# Patient Record
Sex: Male | Born: 1935 | Race: White | Hispanic: No | Marital: Married | Smoking: Former smoker
Health system: Southern US, Community
[De-identification: ages and names within clinical notes are randomized; demographics above are authoritative.]

## PROBLEM LIST (undated history)

## (undated) DIAGNOSIS — E119 Type 2 diabetes mellitus without complications: Secondary | ICD-10-CM

## (undated) DIAGNOSIS — R55 Syncope and collapse: Secondary | ICD-10-CM

## (undated) DIAGNOSIS — I499 Cardiac arrhythmia, unspecified: Secondary | ICD-10-CM

## (undated) DIAGNOSIS — I480 Paroxysmal atrial fibrillation: Secondary | ICD-10-CM

## (undated) DIAGNOSIS — R410 Disorientation, unspecified: Secondary | ICD-10-CM

## (undated) DIAGNOSIS — E785 Hyperlipidemia, unspecified: Secondary | ICD-10-CM

## (undated) DIAGNOSIS — I10 Essential (primary) hypertension: Secondary | ICD-10-CM

## (undated) HISTORY — DX: Disorientation, unspecified: R41.0

## (undated) HISTORY — PX: NO PAST SURGERIES: SHX2092

---

## 2001-05-27 ENCOUNTER — Emergency Department (HOSPITAL_COMMUNITY): Admission: EM | Admit: 2001-05-27 | Discharge: 2001-05-27 | Payer: Self-pay | Admitting: Emergency Medicine

## 2007-03-10 ENCOUNTER — Inpatient Hospital Stay (HOSPITAL_COMMUNITY): Admission: EM | Admit: 2007-03-10 | Discharge: 2007-03-12 | Payer: Self-pay | Admitting: Emergency Medicine

## 2007-03-11 ENCOUNTER — Encounter (INDEPENDENT_AMBULATORY_CARE_PROVIDER_SITE_OTHER): Payer: Self-pay | Admitting: Internal Medicine

## 2010-05-23 NOTE — H&P (Signed)
NAME:  Gerald [name], Gerald [name] NO.:  [id number]   MEDICAL RECORD NO.:  [id number]          PATIENT TYPE:  EMS   LOCATION:  MAJO                         FACILITY:  [facility]   PHYSICIAN:  Herbie Saxon, MDDATE OF BIRTH:  12/31/1927   DATE OF ADMISSION:  03/10/2007  DATE OF DISCHARGE:                              HISTORY & PHYSICAL   PRIMARY CARE PHYSICIAN:  Gloriajean Dell. Andrey Campanile, M.D.   Health care Karthikeya Funke is his wife, Aurea Graff, [phone] phone number.   CODE STATUS:  He is a full code.   PRESENTING COMPLAINT:  Right arm weakness x1 day.   HISTORY OF PRESENT ILLNESS:  This is a 77 year old Caucasian male with  past medical history of hypertension, diabetes, hyperlipidemia, remote  history of tobacco abuse who was quite well until 5:30 A.M. this morning  when he woke up to feed the dog and he noted weakness in the right arm.  This weakness has spontaneously resolved and he walked by himself to the  emergency room.  He also noticed popping sensation in bilateral side of  the neck over last night but he denies any headache, no dizziness;  there is no palpitations, no chest pain, no shortness of breath.  There  is no slurring of speech.  There is no leg weakness, no unstable gait,  no drooling of saliva.  The patient has never experienced this type of  weakness episode before.  No history of syncopal episode.  Denies any  skin rash or joint swelling.  No symptoms referable to the genitourinary  or gastrointestinal system.  He has given the history himself.  There is  no features of aphasia.   PAST MEDICAL HISTORY:  1. Hypertension.  2. Diabetes.  3. Hyperlipidemia.   PAST SURGICAL HISTORY:  Nil of note.   FAMILY HISTORY:  Father diabetes. Brother Alzheimer's disease.  Brother  heart disease.   SOCIAL HISTORY:  He is a retired Merchandiser, retail at [employer].  He quit smoking  more than 30 years ago.  He had smoked a pipe and cigarettes, less than  one pack per day for upwards  of 25 years.  No history of illicit drugs  or alcohol abuse.  He is married with children.   MEDICATIONS:  1. Lipitor one tablet daily.  2. Lisinopril one tablet daily.  3. Metformin one tablet twice daily.   ALLERGIES:  No known drug allergies.   REVIEW OF SYSTEMS:  Fourteen systems were reviewed.  Pertinent history  as in the history of presenting illness.   PHYSICAL EXAMINATION:  GENERAL APPEARANCE:  This is an elderly man, not  in acute respiratory distress.  VITAL SIGNS:  Temperature is 98,  pulse is 66, respiratory rate 16,  blood pressure 152/77.  HEENT: Pupils equal, round, reactive to light and accommodation.  Extraocular movements intact.  Head is normocephalic, atraumatic.  Mucous membranes moist.  Oropharynx and nasopharynx are clear.  NECK:  Supple.  There is no elevated JVD, thyromegaly or carotid bruit.  HEART:  Heart S1 and S2, regular rate and rhythm.  CHEST:  Clinically clear.  ABDOMEN:  Soft, nontender,  no organomegaly.  Bowel sounds are  normoactive.  Inguinal orifices are patent.  NEUROLOGICAL:  He is alert and oriented to time, place and person.  Power is 5 in all limbs.  Deep tendon reflexes 2+ globally and cranial  nerves II-XII are grossly intact.  Strength is grossly intact.  Peripheral pulses present.  No pedal edema.   LABORATORY DATA:  CBC with white blood cell count 4.8, hematocrit 40,  platelet count 170,000.  INR is 0.9.  PTT 27.  PT 12.1.  Chemistries  show sodium of 138, potassium 3.8, chloride 105, BUN 15, creatinine 1.0.  EKG:  62 beats per minute.   ASSESSMENT:  1. Transient ischemic attack, rule out acute cerebrovascular accident.  2. Moderate hypertension.  3. Type 2 diabetes , suboptimal control.  4. Hyperlipidemia on Statin.  5. Remote history of tobacco abuse.  6. First degree heart block.   PLAN:  The patient is to be admitted to neuro-telemetry bed.  Will work  him up with an MRI brain and MRA head and neck.  He is to be on  bedrest  and strict falls, seizure and operation precautions.  Continue his home  medications of Lipitor, lisinopril, metformin, add amlodipine 5 mg p.o.  daily.  Accu-Cheks a.c. and h.s. with sliding scale insulin NovoLog  coverage to optimize his diabetes control.  He will be on Lovenox 40 mg  subcutaneously daily and aspirin 325 mg daily, Protonix 40 mg p.o.  daily, DuoNeb nebulizer one unit dose q.6h. p.r.n.  Will get a thyroid  function test, fasting lipids, homocysteine.  Consider neurology  evaluation as in or outpatient.  His medications, tests and treatment  plans have been explained to him and his family.  They verbalized  understanding.   Time of encounter 45 minutes.      Herbie Saxon, MD  Electronically Signed     MIO/MEDQ  D:  03/10/2007  T:  03/10/2007  Job:  [id number]   cc:   Gloriajean Dell. Andrey Campanile, M.D.

## 2010-05-23 NOTE — Discharge Summary (Signed)
Gerald [name], LYSTER NO.:  [id number]   MEDICAL RECORD NO.:  [id number]          PATIENT TYPE:  INP   LOCATION:  3012                         FACILITY:  [facility]   PHYSICIAN:  Altha Harm, MDDATE OF BIRTH:  12/31/1927   DATE OF ADMISSION:  03/10/2007  DATE OF DISCHARGE:  03/12/2007                               DISCHARGE SUMMARY   DISCHARGE DISPOSITION:  Home.   FINAL DISCHARGE DIAGNOSES:  1. Acute left frontal lobe infarct.  2. Hyperhomocysteinemia.  3. Diabetes, type 2, poorly-controlled.  4. History of hypertension.  5. History of hyperlipidemia, currently on therapy.  6. History of hypertension, blood pressure well-controlled.   DISCHARGE MEDICATIONS:  1. Folate 1 mg p.o. daily.  2. Amaryl 1 mg p.o. daily.  3. Metformin 1000 mg p.o. b.i.d.  4. Lipitor 10 mg p.o. daily.  5. Lisinopril/hydrochlorothiazide 20/25 mg p.o. daily.  6. Ocuvite one tablet p.o. b.i.d.   CONSULTANTS:  None.   PROCEDURES:  None.   DIAGNOSTIC STUDIES:  1. MRI of the brain with and without contrast, which shows:  Acute nonhemorrhagic infarct, posterior left frontal lobe.  Atrophy without hydrocephalus.  Prominent small-vessel-disease type changes.  No abnormal intracranial enhancing lesion.  Prominent cerebrospinal fluid space, posterior and superior right  parietal region.  1. Angiography of the head, which shows:  Mild branch basilar intracranial atherosclerotic type changes.  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  Right vertebral artery appears to end in a pica distribution.  1. MRA of the neck, which shows:  Mild narrowing of the proximal internal carotid arteries bilaterally,  without evidence of a hemodynamically significant stenosis.  Left vertebral artery dominant.  Mild narrowing and ectasia of the proximal left vertebral artery.  Focal  loss of signal, proximal right vertebral artery, which may represent  result  of narrowing, versus artifact or combination of such.  Right vertebral artery appears to end in a pica distribution.  1. Chest x-ray, portable, one view, which shows bilateral partially      calcified pleural flecks, consistent with previous asbestos      exposure, no acute findings.  2. Two-D echocardiogram, which shows an ejection fraction of 60-75%.      No diagnostic evidence of a left ventricular regional wall motion      abnormality.  Left ventricular wall thickness was mildly increased.      There is an increased relative contribution of atrial contraction      to left ventricular filling.  No likely discrete intracardiac      source of emboli present.   CHIEF COMPLAINT:  Weakness in the right upper extremity.   HISTORY OF PRESENT ILLNESS:  Please see the H&P dictated by Dr. Christella Noa  for details of the HPI.  However, this is a gentleman, who was sitting  and got up to feed his dogs and recognized that he had weakness in the  right upper extremity.  The patient had a full resolution of symptoms by  the time he arrived at the emergency room.   HOSPITAL COURSE:  1. The patient was  admitted to the hospital under observation.  He was      placed on telemetry and showed no evidence of ectopy or arrhythmia      while on telemetry.  Studies were done as noted above.  The patient      had no apparent abnormalities noted.  He is currently able to      ambulate on a level surface without any difficulty.  The patient is      able to dress himself without any difficulty.  He has had no      problems with speech of any dysphagia.  Currently, the patient is      requesting to go home and to have physical therapy evaluate him in      the home setting or outpatient setting.  Please note that PT, OT      and speech have not yet seen him.  However, the patient is      requesting to go home and, without any apparent evidence of any      overt symptoms, I think it is safe for the patient to go  home and      have his therapies done in the home setting.  In terms of the      findings on the MRI, I have discussed this with Dr. Orlin Hilding for      neurology.  She agrees with me that the findings of the aneurysm      are likely an over-read by radiology and recommends that the      patient have a followup MRI within one year to further evaluate any      changes.  In terms of his hyperhomocysteinemia, the patient was      found to have a homocystine level elevated at 19.5 and has been      placed on folate 1 mg daily.  2. Diabetes, type 2:  The patient had poorly controlled diabetes with      a hemoglobin A1c of 8.2.  The patient has been on Glucophage 1000      mg b.i.d. and I have added Amaryl 1 mg.  He will probably need      further titration as an outpatient.  Please note that his      cholesterol was checked and the patient has an LDL of 104 and an      HDL of 25.  He is continued on his Lipitor and his blood pressures      were well-controlled and he was continued on his      lisinopril/hydrochlorothiazide.   RECOMMENDATIONS:  For the patient to follow up with his primary care  physician, Dr. Andrey Campanile, in three to five days and for home health  evaluation of PT and OT.      Altha Harm, MD  Electronically Signed     MAM/MEDQ  D:  03/12/2007  T:  03/12/2007  Job:  [id number]

## 2010-10-02 LAB — CK TOTAL AND CKMB (NOT AT ARMC)
CK, MB: 1
Relative Index: INVALID
Relative Index: INVALID
Relative Index: INVALID

## 2010-10-02 LAB — DIFFERENTIAL
Eosinophils Relative: 3
Lymphocytes Relative: 24
Lymphs Abs: 1.2
Monocytes Absolute: 0.4

## 2010-10-02 LAB — I-STAT 8, (EC8 V) (CONVERTED LAB)
BUN: 15
Chloride: 105
HCT: 43
Hemoglobin: 14.6
Operator id: 234501
Sodium: 138

## 2010-10-02 LAB — TROPONIN I: Troponin I: 0.01

## 2010-10-02 LAB — CBC
HCT: 40.8
Hemoglobin: 14.1
WBC: 4.8

## 2010-10-02 LAB — LIPID PANEL
Cholesterol: 155
HDL: 24 — ABNORMAL LOW
Total CHOL/HDL Ratio: 6.5

## 2010-10-02 LAB — HEPATIC FUNCTION PANEL
ALT: 13
Alkaline Phosphatase: 78
Bilirubin, Direct: <0.1
Total Protein: 6.5

## 2010-10-02 LAB — POCT I-STAT CREATININE
Creatinine, Ser: 1
Operator id: 234501

## 2012-09-12 ENCOUNTER — Ambulatory Visit: Payer: Self-pay | Admitting: Cardiovascular Disease

## 2012-09-19 ENCOUNTER — Ambulatory Visit (INDEPENDENT_AMBULATORY_CARE_PROVIDER_SITE_OTHER): Payer: Medicare Other | Admitting: Cardiovascular Disease

## 2012-09-19 ENCOUNTER — Encounter: Payer: Self-pay | Admitting: Cardiovascular Disease

## 2012-09-19 VITALS — BP 118/70 | HR 117 | Ht 71.0 in | Wt 162.8 lb

## 2012-09-19 DIAGNOSIS — I4891 Unspecified atrial fibrillation: Secondary | ICD-10-CM

## 2012-09-19 DIAGNOSIS — E785 Hyperlipidemia, unspecified: Secondary | ICD-10-CM

## 2012-09-19 DIAGNOSIS — E119 Type 2 diabetes mellitus without complications: Secondary | ICD-10-CM

## 2012-09-19 DIAGNOSIS — Z7189 Other specified counseling: Secondary | ICD-10-CM

## 2012-09-19 MED ORDER — METOPROLOL SUCCINATE ER 50 MG PO TB24: 50.0000 mg | ORAL_TABLET | Freq: Every day | ORAL | Status: DC

## 2012-09-19 MED ORDER — APIXABAN 2.5 MG PO TABS: ORAL_TABLET | ORAL | Status: DC

## 2012-09-19 NOTE — Patient Instructions (Addendum)
Your physician recommends that you return for lab work fasting.  Your physician has requested that you have an echocardiogram. Echocardiography is a painless test that uses sound waves to create images of your heart. It provides your doctor with information about the size and shape of your heart and how well your heart's chambers and valves are working. This procedure takes approximately one hour. There are no restrictions for this procedure.  Your physician has recommended you make the following change in your medication: start Eliquis as directed. Increase the metoprolol to 50 mg once daily.  Your physician recommends that you schedule a follow-up appointment in: 2-3 weeks.

## 2012-09-26 ENCOUNTER — Encounter: Payer: Self-pay | Admitting: Cardiovascular Disease

## 2012-09-26 DIAGNOSIS — E785 Hyperlipidemia, unspecified: Secondary | ICD-10-CM | POA: Insufficient documentation

## 2012-09-26 DIAGNOSIS — E119 Type 2 diabetes mellitus without complications: Secondary | ICD-10-CM | POA: Insufficient documentation

## 2012-09-26 NOTE — Progress Notes (Signed)
Patient ID: Raelyn Number, male   DOB: March 08, 1926, 79 y.o.   MRN: [id number]     PATIENT PROFILE:  Gerald [name] is an 79 year old gentleman who is referred through the courtesy of Dr. Benedetto Goad for evaluation of new onset atrial fibrillation   HPI: Gerald [name] denies any known prior history of cardiac arrhythmia. He does have a history of type 2 diabetes mellitus, hyperlipidemia, hypertension, and at times has noticed some mild dizziness. He apparently saw Dr. Benedetto Goad on 08/20/2012 with an irregular heart rhythm and was found to be in atrial fibrillation. He now presents for cardiology evaluation. Is to be sent denies chest pain. He denies syncope. He denies wheezing. He had been taking metoprolol succinate 25 mg in addition to Prinzide 20/25 aspirin 81 mg Lipitor 20 mg in addition to his Amaryl and metformin for diabetes.   History reviewed. No pertinent past surgical history.  No Known Allergies  Current Outpatient Prescriptions  Medication Sig Dispense Refill  . aspirin 81 MG tablet Take 81 mg by mouth daily.      Marland Kitchen atorvastatin (LIPITOR) 20 MG tablet Take 20 mg by mouth daily.      . folic acid (FOLVITE) 1 MG tablet Take 1 mg by mouth daily.      Marland Kitchen glimepiride (AMARYL) 1 MG tablet Take 1 mg by mouth daily before breakfast.      . lisinopril-hydrochlorothiazide (PRINZIDE,ZESTORETIC) 20-25 MG per tablet Take 1 tablet by mouth daily. Takes 1/2 tablet daily      . metFORMIN (GLUCOPHAGE) 1000 MG tablet Take 1,000 mg by mouth 2 (two) times daily with a meal.      . Multiple Vitamins-Minerals (PRESERVISION AREDS PO) Take 1 tablet by mouth 2 (two) times daily.      . tamsulosin (FLOMAX) 0.4 MG CAPS capsule Take by mouth.      Marland Kitchen apixaban (ELIQUIS) 2.5 MG TABS tablet Take 1/2 tablet  7 tablet  0  . metoprolol succinate (TOPROL-XL) 50 MG 24 hr tablet Take 1 tablet (50 mg total) by mouth daily. Take with or immediately following a meal.  30 tablet  6   No current  facility-administered medications for this visit.    Social history is notable in that he is married. He has one child 3 grandchildren and one great-grandchild. 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 He was in the [employer] from 1951 through 1955. He previously had smoked a pipe but quit 30 years ago. There is no alcohol use.  Family History  Problem Relation Age of Onset  . Heart disease Mother   . Heart failure Mother     ROS is negative for fever chills or night sweats. He denies visual changes. He denies rash. He denies tremors. He denies any known thyroid problems. He denies chest pressure. He denies change in bowel or bladder habits. He denies bleeding. There is no claudication. He denies significant edema. Other system review is negative.  PE BP 118/70  Pulse 117  Ht 5\' 11"  (1.803 m)  Wt 162 lb 12.8 oz (73.846 kg)  BMI 22.72 kg/m2 General: Alert, oriented, no distress.  Skin: normal turgor, no rashes HEENT: Normocephalic, atraumatic. Pupils round and reactive; sclera anicteric; Fundi mild arteriolar narrowing. No hemorrhages or exudates. Nose without nasal septal hypertrophy Mouth/Parynx benign; full dentures;  Mallinpatti scale 3 Neck: No JVD, no carotid briuts Lungs: clear to ausculatation and percussion; no wheezing or rales Heart:  Irregularly irregular rhythm at 117 beats per minute, 1/6 systolic murmur Abdomen: soft, nontender; no hepatosplenomehaly, BS+; abdominal aorta nontender and not dilated by palpation. Pulses 2+ Extremities: no clubbinbg cyanosis or edema, Homan's sign negative  Neurologic: grossly nonfocal    ECG: Atrial fibrillation at 117 beats per minute. No significant ST-T changes.  LABS:  BMET    Component Value Date/Time   NA 138 03/10/2007 1215   K 3.8 03/10/2007 1215   CL 105 03/10/2007 1215   GLUCOSE 137* 03/10/2007 1215   BUN 15 03/10/2007 1215   CREATININE 1.0 03/10/2007 1215     Hepatic  Function Panel     Component Value Date/Time   PROT 6.5 03/10/2007 2045   ALBUMIN 3.6 03/10/2007 2045   AST 21 03/10/2007 2045   ALT 13 03/10/2007 2045   ALKPHOS 78 03/10/2007 2045   BILITOT 0.5 03/10/2007 2045   BILIDIR <0.1 03/10/2007 2045   IBILI NOT CALCULATED 03/10/2007 2045     CBC    Component Value Date/Time   WBC 4.8 03/10/2007 1145   RBC 4.92 03/10/2007 1145   HGB 14.6 03/10/2007 1215   HCT 43.0 03/10/2007 1215   PLT 170 03/10/2007 1145   MCV 82.9 03/10/2007 1145   MCHC 34.7 03/10/2007 1145   RDW 13.9 03/10/2007 1145   LYMPHSABS 1.2 03/10/2007 1145   MONOABS 0.4 03/10/2007 1145   EOSABS 0.1 03/10/2007 1145   BASOSABS 0.0 03/10/2007 1145     BNP No results found for this basename: probnp    Lipid Panel     Component Value Date/Time   CHOL  Value: 155        ATP III CLASSIFICATION:  <200     mg/dL   Desirable  161-096  mg/dL   Borderline High  >=045    mg/dL   High 4/0/9811 9147   TRIG 136 03/11/2007 0535   HDL 24* 03/11/2007 0535   CHOLHDL 6.5 03/11/2007 0535   VLDL 27 03/11/2007 0535   LDLCALC  Value: 104        Total Cholesterol/HDL:CHD Risk Coronary Heart Disease Risk Table                     Men   Women  1/2 Average Risk   3.4   3.3* 03/11/2007 0535     RADIOLOGY: No results found.   ASSESSMENT AND PLAN: Gerald [name] is an 79 year old white gentleman with a history of diabetes mellitus, hypertension, and hyperlipidemia who now presents with atrial fibrillation documented initially on 08/20/2012 when seen by Dr. Andrey Campanile. Presently, I have discussed with him the importance of initiating anticoagulation therapy for reduction of thromboembolic risk.  I will start him on low-dose Eliquis at 2.5 mg twice a day. I further titrating his Toprol XL to 50 mg daily. I am scheduling him for a 2-D echo Doppler study as well as laboratory. I will see him back in the office in 2-3 weeks for followup evaluation.   Gerald Bihari, MD, [facility] 09/26/2012 5:57 PM

## 2012-09-29 ENCOUNTER — Ambulatory Visit (HOSPITAL_COMMUNITY)
Admission: RE | Admit: 2012-09-29 | Discharge: 2012-09-29 | Disposition: A | Discharge status: Home / Self Care | Payer: Medicare Other | Source: Ambulatory Visit | Attending: Cardiovascular Disease | Admitting: Cardiovascular Disease

## 2012-09-29 ENCOUNTER — Other Ambulatory Visit (HOSPITAL_COMMUNITY): Payer: Self-pay | Admitting: Cardiovascular Disease

## 2012-09-29 DIAGNOSIS — I4891 Unspecified atrial fibrillation: Secondary | ICD-10-CM | POA: Insufficient documentation

## 2012-09-29 NOTE — Progress Notes (Signed)
2D Echo Performed 09/29/2012    Dalanie Kisner, RCS  

## 2012-09-30 LAB — COMPREHENSIVE METABOLIC PANEL
Albumin: 4.1 g/dL (ref 3.5–5.2)
CO2: 28 mEq/L (ref 19–32)
Glucose, Bld: 150 mg/dL — ABNORMAL HIGH (ref 70–99)
Sodium: 142 mEq/L (ref 135–145)
Total Bilirubin: 0.7 mg/dL (ref 0.3–1.2)
Total Protein: 7.4 g/dL (ref 6.0–8.3)

## 2012-09-30 LAB — CBC
MCV: 82.2 fL (ref 78.0–100.0)
Platelets: 207 10*3/uL (ref 150–400)
RBC: 4.77 MIL/uL (ref 4.22–5.81)
WBC: 5.5 10*3/uL (ref 4.0–10.5)

## 2012-09-30 LAB — LIPID PANEL
Cholesterol: 131 mg/dL (ref 0–200)
Triglycerides: 84 mg/dL (ref <150)

## 2012-09-30 LAB — MAGNESIUM: Magnesium: 1.8 mg/dL (ref 1.5–2.5)

## 2012-10-02 ENCOUNTER — Telehealth: Payer: Self-pay | Admitting: Cardiovascular Disease

## 2012-10-02 ENCOUNTER — Encounter: Payer: Self-pay | Admitting: Cardiovascular Disease

## 2012-10-02 ENCOUNTER — Ambulatory Visit (INDEPENDENT_AMBULATORY_CARE_PROVIDER_SITE_OTHER): Payer: Medicare Other | Admitting: Cardiovascular Disease

## 2012-10-02 VITALS — BP 134/74 | HR 112 | Ht 71.0 in | Wt 160.5 lb

## 2012-10-02 DIAGNOSIS — E119 Type 2 diabetes mellitus without complications: Secondary | ICD-10-CM

## 2012-10-02 DIAGNOSIS — I4891 Unspecified atrial fibrillation: Secondary | ICD-10-CM

## 2012-10-02 DIAGNOSIS — E785 Hyperlipidemia, unspecified: Secondary | ICD-10-CM

## 2012-10-02 DIAGNOSIS — Z7189 Other specified counseling: Secondary | ICD-10-CM

## 2012-10-02 MED ORDER — APIXABAN 2.5 MG PO TABS: 2.5000 mg | ORAL_TABLET | Freq: Two times a day (BID) | ORAL | Status: DC

## 2012-10-02 MED ORDER — PROPAFENONE HCL 150 MG PO TABS: 150.0000 mg | ORAL_TABLET | Freq: Three times a day (TID) | ORAL | Status: DC

## 2012-10-02 NOTE — Telephone Encounter (Signed)
Returned call to pt and spoke w/ pt's son, Gerald [name].  Stated he has questions about Eliquis and Metoprolol.  Confirmed pt is supposed to take Eliquis 2.5 mg twice daily and Metoprolol 25 mg daily.  Son stated pt has Eliquis 5 mg tabs and advised pt take 1/2 tab twice daily.  Pt also has Metoprolol 50 mg tabs and advised to take 1/2 tab daily.  Son verbalized understanding.  Rx sent to pharmacy for Eliquis per son's request.  Pt just refilled Metoprolol and does not need refills per son.

## 2012-10-02 NOTE — Progress Notes (Signed)
Patient ID: Gerald [name], male   DOB: 1926-10-16, 79 y.o.   MRN: [id number]     HPI: Gerald [name] is an 79 year old gentleman who I saw initially on 09/19/2012 30 courtesy of Dr. Benedetto Goad for evaluation of new onset atrial fibrillation.  He does have a history of type 2 diabetes mellitus, hyperlipidemia, hypertension, and at times has noticed some mild dizziness. He apparently saw Dr. Benedetto Goad on 08/20/2012 with an irregular heart rhythm and was found to be in atrial fibrillation. When I initially saw him he denied any chest pain, presyncope or syncope. He denies wheezing. He had been taking metoprolol succinate 25 mg in addition to Prinzide 20/25 aspirin 81 mg Lipitor 20 mg in addition to his Amaryl and metformin for diabetes. At that time, I recommended further titration of his metoprolol succinate 50 mg daily. Also started on Eliquis 2.5 mg twice a day. I scheduled him for a 2-D echo Doppler study which confirmed normal systolic function although he did not moderate concentric LVH. Ejection fraction was 55-60%. There was mild aortic valve sclerosis without stenosis and mild AR the head mild biatrial enlargement. Apparently, Gerald [name] never did titrate his Toprol to 50 mg daily.  He states he has noticed that he has had some presyncopal-like spells after he had taken his Prinzide. He presents to the office today for followup evaluation.   History reviewed. No pertinent past surgical history.  No Known Allergies  Current Outpatient Prescriptions  Medication Sig Dispense Refill  . apixaban (ELIQUIS) 2.5 MG TABS tablet Take 1/2 tablet  7 tablet  0  . aspirin 81 MG tablet Take 81 mg by mouth daily.      Marland Kitchen atorvastatin (LIPITOR) 20 MG tablet Take 20 mg by mouth daily.      . folic acid (FOLVITE) 1 MG tablet Take 1 mg by mouth daily.      Marland Kitchen glimepiride (AMARYL) 1 MG tablet Take 1 mg by mouth daily before breakfast.      . lisinopril-hydrochlorothiazide (PRINZIDE,ZESTORETIC) 20-25 MG per  tablet Take 1 tablet by mouth daily. Takes 1/2 tablet daily      . metFORMIN (GLUCOPHAGE) 1000 MG tablet Take 1,000 mg by mouth 2 (two) times daily with a meal.      . metoprolol succinate (TOPROL-XL) 25 MG 24 hr tablet Take 25 mg by mouth daily.      . Multiple Vitamins-Minerals (PRESERVISION AREDS PO) Take 1 tablet by mouth 2 (two) times daily.      . tamsulosin (FLOMAX) 0.4 MG CAPS capsule Take by mouth.       No current facility-administered medications for this visit.    Social history is notable in that he is married. He has one child 3 grandchildren and one great-grandchild. 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 He was in the [employer] from 1951 through 1955. He previously had smoked a pipe but quit 30 years ago. There is no alcohol use.  Family History  Problem Relation Age of Onset  . Heart disease Mother   . Heart failure Mother     ROS is negative for fever chills or night sweats. He denies visual changes. He denies rash. He denies tremors. He denies any known thyroid problems. He denies chest pressure. He denies change in bowel or bladder habits. He denies bleeding. There is no claudication. He denies significant edema. Other system review is negative.  PE BP 134/74  Pulse  112  Ht 5\' 11"  (1.803 m)  Wt 160 lb 8 oz (72.802 kg)  BMI 22.4 kg/m2 General: Alert, oriented, no distress.  Skin: normal turgor, no rashes HEENT: Normocephalic, atraumatic. Pupils round and reactive; sclera anicteric; Fundi mild arteriolar narrowing. No hemorrhages or exudates. Nose without nasal septal hypertrophy Mouth/Parynx benign; full dentures;  Mallinpatti scale 3 Neck: No JVD, no carotid briuts Lungs: clear to ausculatation and percussion; no wheezing or rales Heart: Irregularly irregular rhythm at 110 - 120 beats per minute, 1/6 systolic murmur Abdomen: soft, nontender; no hepatosplenomehaly, BS+; abdominal aorta nontender and not  dilated by palpation. Pulses 2+ Extremities: no clubbinbg cyanosis or edema, Homan's sign negative  Neurologic: grossly nonfocal    ECG: Atrial fibrillation at 112 beats per minute. No significant ST-T changes. QTc interval 434 ms.  LABS:  BMET    Component Value Date/Time   NA 142 09/30/2012 0839   K 4.1 09/30/2012 0839   CL 100 09/30/2012 0839   CO2 28 09/30/2012 0839   GLUCOSE 150* 09/30/2012 0839   BUN 21 09/30/2012 0839   CREATININE 1.09 09/30/2012 0839   CREATININE 1.0 03/10/2007 1215   CALCIUM 9.5 09/30/2012 0839     Hepatic Function Panel     Component Value Date/Time   PROT 7.4 09/30/2012 0839   ALBUMIN 4.1 09/30/2012 0839   AST 18 09/30/2012 0839   ALT 9 09/30/2012 0839   ALKPHOS 90 09/30/2012 0839   BILITOT 0.7 09/30/2012 0839   BILIDIR <0.1 03/10/2007 2045   IBILI NOT CALCULATED 03/10/2007 2045     CBC    Component Value Date/Time   WBC 5.5 09/30/2012 0839   RBC 4.77 09/30/2012 0839   HGB 12.8* 09/30/2012 0839   HCT 39.2 09/30/2012 0839   PLT 207 09/30/2012 0839   MCV 82.2 09/30/2012 0839   MCH 26.8 09/30/2012 0839   MCHC 32.7 09/30/2012 0839   RDW 14.5 09/30/2012 0839   LYMPHSABS 1.2 03/10/2007 1145   MONOABS 0.4 03/10/2007 1145   EOSABS 0.1 03/10/2007 1145   BASOSABS 0.0 03/10/2007 1145     BNP No results found for this basename: probnp    Lipid Panel     Component Value Date/Time   CHOL 131 09/30/2012 0839   TRIG 84 09/30/2012 0839   HDL 44 09/30/2012 0839   CHOLHDL 3.0 09/30/2012 0839   VLDL 17 09/30/2012 0839   LDLCALC 70 09/30/2012 0839     RADIOLOGY: No results found.   ASSESSMENT AND PLAN: Gerald [name] is an 79 year old white gentleman with a history of diabetes mellitus, hypertension, and hyperlipidemia who has documented atrial fibrillation for at least several months when he was initially seen by Dr. Andrey Campanile the atrial fibrillation documented initially on 08/20/2012 when seen by Dr. Andrey Campanile. He apparently never did further titrate his metoprolol to 50 mg and  has only been taking Toprol XL 25 mg daily. Heart rate today is essentially unchanged from his initial evaluation.  I am now  recommending that he try to increase this to 25 mg twice a day. With his echo demonstrating normal systolic function I will try initiating Propofenone150 mg every 8 hours. I have recommended that he discontinue his Prinzide. He is now on anticoagulation for several weeks. I'll see him back in the office in 2 weeks. At that time he is still in atrial fibrillation plans will be made to schedule him for outpatient DC cardioversion. If his pulse gets below 60 he will hold his evening dose of Toprol.  Since he does not have established coronary artery disease I recommended he discontinue his baby aspirin to continue with the Elliquis 2.5 twice a day.  Lennette Bihari, MD, [facility] 10/02/2012 9:59 AM

## 2012-10-02 NOTE — Patient Instructions (Addendum)
Your physician has recommended you make the following change in your medication: take the metoprolol 25 mg twice daily.  start the new prescription for propafenone 150 mg. This has already been sent to your pharmacy. STOP your baby asiprin.  Stop the lisinopril.  Your physician recommends that you schedule a follow-up appointment in: 2 WEEKS.

## 2012-10-02 NOTE — Progress Notes (Signed)
Quick Note:  Called patient and gave normal results. He then informed me that he has appointment this morning. ______

## 2012-10-02 NOTE — Telephone Encounter (Signed)
Please call-just saw Dr Geoffry Paradise about his medicine.

## 2012-10-03 ENCOUNTER — Encounter: Payer: Self-pay | Admitting: Cardiovascular Disease

## 2012-10-09 ENCOUNTER — Telehealth: Payer: Self-pay | Admitting: Cardiovascular Disease

## 2012-10-09 NOTE — Telephone Encounter (Signed)
Please call-question about his medicine. °

## 2012-10-09 NOTE — Telephone Encounter (Signed)
Returned patient's [place] spoke with his wife. She was concerned that he had missed his scheduled dose of propafenone. He took it one hour later than he should have. Stated to her that this should be fine. Just push the next dose back by one hour. They can resume the normal schedule tomorrow. Wife voiced understanding.

## 2012-10-16 ENCOUNTER — Encounter: Payer: Self-pay | Admitting: Cardiovascular Disease

## 2012-10-16 ENCOUNTER — Ambulatory Visit (INDEPENDENT_AMBULATORY_CARE_PROVIDER_SITE_OTHER): Payer: Medicare Other | Admitting: Cardiovascular Disease

## 2012-10-16 VITALS — BP 136/70 | HR 79 | Ht 71.0 in | Wt 160.2 lb

## 2012-10-16 DIAGNOSIS — E785 Hyperlipidemia, unspecified: Secondary | ICD-10-CM

## 2012-10-16 DIAGNOSIS — I4891 Unspecified atrial fibrillation: Secondary | ICD-10-CM

## 2012-10-16 DIAGNOSIS — E119 Type 2 diabetes mellitus without complications: Secondary | ICD-10-CM

## 2012-10-16 DIAGNOSIS — Z01818 Encounter for other preprocedural examination: Secondary | ICD-10-CM

## 2012-10-16 NOTE — Progress Notes (Signed)
Patient ID: Gerald [name], male   DOB: 05/09/1926, 79 y.o.   MRN: [id number]       HPI: Gerald [name] is an 79-year-old gentleman who I saw initially on 09/19/2012 30 courtesy of Dr. Fred Wilson for evaluation of new onset atrial fibrillation.  He does have a history of type 2 diabetes mellitus, hyperlipidemia, hypertension, and at times has noticed some mild dizziness. On 08/20/2012 he was evaluated by Dr. Fred Wilson with an irregular heart rhythm and was found to be in atrial fibrillation. When I initially saw him he denied any chest pain, presyncope or syncope. He denies wheezing. He had been taking metoprolol succinate 25 mg in addition to Prinzide 20/25 aspirin 81 mg Lipitor 20 mg in addition to his Amaryl and metformin for diabetes. At that time, I recommended further titration of his metoprolol succinate 50 mg daily and started on Eliquis 2.5 mg twice a day. A 2-D echo Doppler study which confirmed normal systolic function although he did not moderate concentric LVH. Ejection fraction was 55-60%. There was mild aortic valve sclerosis without stenosis and mild AR the head mild biatrial enlargement.  Gerald [name] inadvertently never did titrate his Toprol to 50 mg daily.  He states he has noticed that he has had some presyncopal-like spells after he had taken his Prinzide. When I saw him on his last evaluation, at that time I did titrate his Toprol to 50 mg daily and also started him on Propulsid on 150 mg every 8 hours. At that time I recommended he discontinue his Prinzide. He presents to the office today for followup evaluation.  Gerald [name] does feel improved. He feels his heart rate is controlled. He is now walking several miles per day and does note improved energy.   History reviewed. No pertinent past surgical history.  No Known Allergies  Current Outpatient Prescriptions  Medication Sig Dispense Refill  . apixaban (ELIQUIS) 2.5 MG TABS tablet Take 1 tablet (2.5 mg total) by mouth 2  (two) times daily.  60 tablet  5  . atorvastatin (LIPITOR) 20 MG tablet Take 20 mg by mouth daily.      . folic acid (FOLVITE) 1 MG tablet Take 1 mg by mouth daily.      . glimepiride (AMARYL) 1 MG tablet Take 1 mg by mouth daily before breakfast.      . metFORMIN (GLUCOPHAGE) 1000 MG tablet Take 1,000 mg by mouth 2 (two) times daily with a meal.      . metoprolol succinate (TOPROL-XL) 25 MG 24 hr tablet Take 25 mg by mouth 2 (two) times daily.       . Multiple Vitamins-Minerals (PRESERVISION AREDS PO) Take 1 tablet by mouth 2 (two) times daily.      . propafenone (RYTHMOL) 150 MG tablet Take 1 tablet (150 mg total) by mouth every 8 (eight) hours.  30 tablet  6  . tamsulosin (FLOMAX) 0.4 MG CAPS capsule Take by mouth.       No current facility-administered medications for this visit.    Social history is notable in that he is married. He has one child 3 grandchildren and one great-grandchild. 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 He completed 12th grade of education. He was in the [employer] from 1951 through 1955. He previously had smoked a pipe but quit 30 years ago. There is no alcohol use.  Family History  Problem Relation Age of Onset  . Heart disease Mother   .   Heart failure Mother     ROS is negative for fever chills or night sweats. He denies visual changes. He denies rash. He denies tremors. He denies any known thyroid problems. He denies chest pressure. He denies change in bowel or bladder habits. He denies bleeding. There is no claudication. He denies significant edema. Other 12 point system review is negative.  PE BP 136/70  Pulse 79  Ht 5' 11" (1.803 m)  Wt 160 lb 3.2 oz (72.666 kg)  BMI 22.35 kg/m2 General: Alert, oriented, no distress.  Skin: normal turgor, no rashes HEENT: Normocephalic, atraumatic. Pupils round and reactive; sclera anicteric; Fundi mild arteriolar narrowing. No hemorrhages or exudates. Nose without nasal septal  hypertrophy Mouth/Parynx benign; full dentures;  Mallinpatti scale 3 Neck: No JVD, no carotid briuts Lungs: clear to ausculatation and percussion; no wheezing or rales Heart: Irregularly irregular rhythm with an improved rate in the 70s beats per minute, 1/6 systolic murmur Abdomen: soft, nontender; no hepatosplenomehaly, BS+; abdominal aorta nontender and not dilated by palpation. Pulses 2+ Extremities: no clubbinbg cyanosis or edema, Homan's sign negative  Neurologic: grossly nonfocal    ECG: Atrial fibrillation at 79 beats per minute. No significant ST-T changes. QTc interval 415 ms.  LABS:  BMET    Component Value Date/Time   NA 142 09/30/2012 0839   K 4.1 09/30/2012 0839   CL 100 09/30/2012 0839   CO2 28 09/30/2012 0839   GLUCOSE 150* 09/30/2012 0839   BUN 21 09/30/2012 0839   CREATININE 1.09 09/30/2012 0839   CREATININE 1.0 03/10/2007 1215   CALCIUM 9.5 09/30/2012 0839     Hepatic Function Panel     Component Value Date/Time   PROT 7.4 09/30/2012 0839   ALBUMIN 4.1 09/30/2012 0839   AST 18 09/30/2012 0839   ALT 9 09/30/2012 0839   ALKPHOS 90 09/30/2012 0839   BILITOT 0.7 09/30/2012 0839   BILIDIR <0.1 03/10/2007 2045   IBILI NOT CALCULATED 03/10/2007 2045     CBC    Component Value Date/Time   WBC 5.5 09/30/2012 0839   RBC 4.77 09/30/2012 0839   HGB 12.8* 09/30/2012 0839   HCT 39.2 09/30/2012 0839   PLT 207 09/30/2012 0839   MCV 82.2 09/30/2012 0839   MCH 26.8 09/30/2012 0839   MCHC 32.7 09/30/2012 0839   RDW 14.5 09/30/2012 0839   LYMPHSABS 1.2 03/10/2007 1145   MONOABS 0.4 03/10/2007 1145   EOSABS 0.1 03/10/2007 1145   BASOSABS 0.0 03/10/2007 1145     BNP No results found for this basename: probnp    Lipid Panel     Component Value Date/Time   CHOL 131 09/30/2012 0839   TRIG 84 09/30/2012 0839   HDL 44 09/30/2012 0839   CHOLHDL 3.0 09/30/2012 0839   VLDL 17 09/30/2012 0839   LDLCALC 70 09/30/2012 0839     RADIOLOGY: No results found.   ASSESSMENT AND PLAN:  Mr.  [name] is an 79-year-old white gentleman with a history of diabetes mellitus, hypertension, and hyperlipidemia. He has documented atrial fibrillation for at least several months when he was initially seen by Dr. Wilson the atrial fibrillation documented initially on 08/20/2012 when seen by Dr. Wilson. He has been on anticoagulation with eloquence 2.5 mg twice a day since I initially saw him on 09/19/2012. Today, his rate is significantly better controlled on his increased Toprol dose to 50 mg and with the addition of Toprol for now 150 mg every 8 hours. He has normal systolic function on   echo as noted above. After much discussion, we will now make plans to attempt outpatient DC cardioversion. His son will be out of town next week. For this reason, this will be scheduled to be done at [place] the following week. Laboratory obtained the week of his planned cardioversion.   Thomas A. Kelly, MD, FACC 10/16/2012 3:18 PM 

## 2012-10-16 NOTE — Patient Instructions (Signed)
A chest x-ray takes a picture of the organs and structures inside the chest, including the heart, lungs, and blood vessels. This test can show several things, including, whether the heart is enlarges; whether fluid is building up in the lungs; and whether pacemaker / defibrillator leads are still in place.  Your physician recommends that you return for lab work within 7 days of your procedure.  Your physician has recommended that you have a Cardioversion (DCCV). Electrical Cardioversion uses a jolt of electricity to your heart either through paddles or wired patches attached to your chest. This is a controlled, usually prescheduled, procedure. Defibrillation is done under light anesthesia in the hospital, and you usually go home the day of the procedure. This is done to get your heart back into a normal rhythm. You are not awake for the procedure. Please see the instruction sheet given to you today. This will be scheduled for October  24th.   Your physician recommends that you schedule a follow-up appointment ----this will be given to youat the time of the hospital discharge.

## 2012-10-20 ENCOUNTER — Other Ambulatory Visit: Payer: Self-pay | Admitting: *Deleted

## 2012-10-20 DIAGNOSIS — I4891 Unspecified atrial fibrillation: Secondary | ICD-10-CM

## 2012-10-20 DIAGNOSIS — Z01818 Encounter for other preprocedural examination: Secondary | ICD-10-CM

## 2012-10-27 ENCOUNTER — Ambulatory Visit
Admission: RE | Admit: 2012-10-27 | Discharge: 2012-10-27 | Disposition: A | Discharge status: Home / Self Care | Payer: Medicare Other | Source: Ambulatory Visit | Attending: Cardiovascular Disease | Admitting: Cardiovascular Disease

## 2012-10-27 DIAGNOSIS — Z01818 Encounter for other preprocedural examination: Secondary | ICD-10-CM

## 2012-10-27 LAB — COMPREHENSIVE METABOLIC PANEL
Alkaline Phosphatase: 82 U/L (ref 39–117)
BUN: 15 mg/dL (ref 6–23)
Glucose, Bld: 151 mg/dL — ABNORMAL HIGH (ref 70–99)
Total Bilirubin: 0.7 mg/dL (ref 0.3–1.2)

## 2012-10-27 LAB — CBC
HCT: 39.3 % (ref 39.0–52.0)
Hemoglobin: 12.9 g/dL — ABNORMAL LOW (ref 13.0–17.0)
MCH: 27.3 pg (ref 26.0–34.0)
MCHC: 32.8 g/dL (ref 30.0–36.0)
MCV: 83.1 fL (ref 78.0–100.0)

## 2012-10-27 LAB — PROTIME-INR: INR: 1.05 (ref <1.50)

## 2012-10-27 LAB — APTT: aPTT: 30 seconds (ref 24–37)

## 2012-10-30 ENCOUNTER — Telehealth: Payer: Self-pay | Admitting: Cardiovascular Disease

## 2012-10-30 NOTE — Telephone Encounter (Signed)
Returned call and pt verified x 2.  Pt wanted to know if he can eat breakfast.  Pt informed per letter, that he is not to eat or drink after midnight except a sip of water w/ meds.  Pt stated he was told not to take propafenone or his diabetes meds.  Pt advised to follow those instructions and take all other meds as directed.  Pt verbalized understanding and agreed w/ plan.  Pt stated his son is taking him tomorrow at 11:30am.

## 2012-10-30 NOTE — Telephone Encounter (Signed)
Wants someone to call her  Has questions about procedure Mr [place] having tomorrow.

## 2012-10-31 ENCOUNTER — Encounter (HOSPITAL_COMMUNITY)
Admission: RE | Disposition: A | Discharge status: Home / Self Care | Payer: Self-pay | Source: Ambulatory Visit | Attending: Cardiovascular Disease

## 2012-10-31 ENCOUNTER — Encounter (HOSPITAL_COMMUNITY): Payer: Self-pay | Admitting: *Deleted

## 2012-10-31 ENCOUNTER — Ambulatory Visit (HOSPITAL_COMMUNITY)
Admission: RE | Admit: 2012-10-31 | Discharge: 2012-10-31 | Disposition: A | Discharge status: Home / Self Care | Payer: Medicare Other | Source: Ambulatory Visit | Attending: Cardiovascular Disease | Admitting: Cardiovascular Disease

## 2012-10-31 ENCOUNTER — Ambulatory Visit (HOSPITAL_COMMUNITY): Payer: Medicare Other | Admitting: Anesthesiology

## 2012-10-31 ENCOUNTER — Encounter (HOSPITAL_COMMUNITY): Payer: Medicare Other | Admitting: Anesthesiology

## 2012-10-31 DIAGNOSIS — I4891 Unspecified atrial fibrillation: Secondary | ICD-10-CM | POA: Insufficient documentation

## 2012-10-31 DIAGNOSIS — Z01818 Encounter for other preprocedural examination: Secondary | ICD-10-CM

## 2012-10-31 DIAGNOSIS — E785 Hyperlipidemia, unspecified: Secondary | ICD-10-CM | POA: Insufficient documentation

## 2012-10-31 DIAGNOSIS — I10 Essential (primary) hypertension: Secondary | ICD-10-CM | POA: Insufficient documentation

## 2012-10-31 DIAGNOSIS — Z79899 Other long term (current) drug therapy: Secondary | ICD-10-CM | POA: Insufficient documentation

## 2012-10-31 DIAGNOSIS — I359 Nonrheumatic aortic valve disorder, unspecified: Secondary | ICD-10-CM | POA: Insufficient documentation

## 2012-10-31 DIAGNOSIS — E119 Type 2 diabetes mellitus without complications: Secondary | ICD-10-CM | POA: Insufficient documentation

## 2012-10-31 HISTORY — DX: Essential (primary) hypertension: I10

## 2012-10-31 HISTORY — DX: Hyperlipidemia, unspecified: E78.5

## 2012-10-31 HISTORY — DX: Type 2 diabetes mellitus without complications: E11.9

## 2012-10-31 HISTORY — DX: Cardiac arrhythmia, unspecified: I49.9

## 2012-10-31 HISTORY — PX: CARDIOVERSION: SHX1299

## 2012-10-31 SURGERY — CARDIOVERSION
Anesthesia: General | Wound class: Clean

## 2012-10-31 MED ORDER — LIDOCAINE HCL (CARDIAC) 20 MG/ML IV SOLN
INTRAVENOUS | Status: DC | PRN
  Administered 2012-10-31: 80 mg via INTRAVENOUS

## 2012-10-31 MED ORDER — SODIUM CHLORIDE 0.9 % IV SOLN
INTRAVENOUS | Status: DC
  Administered 2012-10-31: 12:00:00 via INTRAVENOUS

## 2012-10-31 MED ORDER — PROPOFOL 10 MG/ML IV BOLUS
INTRAVENOUS | Status: DC | PRN
  Administered 2012-10-31: 100 mg via INTRAVENOUS

## 2012-10-31 NOTE — Anesthesia Preprocedure Evaluation (Addendum)
Anesthesia Evaluation  Patient identified by MRN, date of birth, ID band Patient awake    Reviewed: Allergy & Precautions, H&P , NPO status , Patient's Chart, lab work & pertinent test results, reviewed documented beta blocker date and time   History of Anesthesia Complications Negative for: history of anesthetic complications  Airway Mallampati: IV TM Distance: >3 FB Neck ROM: Limited    Dental  (+) Edentulous Upper, Edentulous Lower, Dental Advisory Given, Lower Dentures and Upper Dentures   Pulmonary former smoker,  ?TIA breath sounds clear to auscultation        Cardiovascular hypertension, Pt. on medications and Pt. on home beta blockers + dysrhythmias Atrial Fibrillation Rhythm:Irregular Rate:Normal  ECHO 09/29/12  EF 55-60% Severe focal basal septal hypertrophy.  Moderate concentric LV hypertrophy. Mild MR.   Neuro/Psych ?TIA - no residual negative psych ROS   GI/Hepatic negative GI ROS, Neg liver ROS,   Endo/Other  diabetes, Well Controlled, Type 2, Oral Hypoglycemic AgentsCBG = 126  Renal/GU negative Renal ROS  negative genitourinary   Musculoskeletal   Abdominal   Peds negative pediatric ROS (+)  Hematology negative hematology ROS (+)   Anesthesia Other Findings   Reproductive/Obstetrics negative OB ROS                        Anesthesia Physical Anesthesia Plan  ASA: III  Anesthesia Plan: General   Post-op Pain Management:    Induction: Intravenous  Airway Management Planned: Mask  Additional Equipment:   Intra-op Plan:   Post-operative Plan:   Informed Consent: I have reviewed the patients History and Physical, chart, labs and discussed the procedure including the risks, benefits and alternatives for the proposed anesthesia with the patient or authorized representative who has indicated his/her understanding and acceptance.     Plan Discussed with: CRNA,  Anesthesiologist and Surgeon  Anesthesia Plan Comments:         Anesthesia Quick Evaluation

## 2012-10-31 NOTE — Transfer of Care (Signed)
Immediate Anesthesia Transfer of Care Note  Patient: Gerald [name]  Procedure(s) Performed: Procedure(s): CARDIOVERSION (N/A)  Patient Location: Endoscopy Unit  Anesthesia Type:General  Level of Consciousness: awake, alert , oriented and patient cooperative  Airway & Oxygen Therapy: Patient Spontanous Breathing and Patient connected to nasal cannula oxygen  Post-op Assessment: Report given to PACU RN and Post -op Vital signs reviewed and stable  Post vital signs: Reviewed  Complications: No apparent anesthesia complications

## 2012-10-31 NOTE — CV Procedure (Signed)
  CARDIOVERSION NOTE   Procedure: Electrical Cardioversion Indications:  Atrial Fibrillation  Procedure Details:  Consent: Risks of procedure as well as the alternatives and risks of each were explained to the (patient/caregiver).  Consent for procedure obtained.  Time Out: Verified patient identification, verified procedure, site/side was marked, verified correct patient position, special equipment/implants available, medications/allergies/relevent history reviewed, required imaging and test results available.  Performed  Patient placed on cardiac monitor, pulse oximetry, supplemental oxygen as necessary.  Sedation given: propofol 100 mg, lidocaine 80 mg per Dr. Ivin Booty Pacer pads placed anterior and posterior chest.  Cardioverted 3 time(s).  Cardioverted at 200J.  Evaluation: Findings: Post procedure EKG shows: Sinus Bradycardia in 50's Complications: None Patient did tolerate procedure well.   12 lead ECG ordered.  Resume outpatient meds; plan OV in 2 weeks.   Lennette Bihari, MD, [facility] 10/31/2012 2:54 PM

## 2012-10-31 NOTE — Interval H&P Note (Signed)
History and Physical Interval Note:  10/31/2012 2:39 PM  Gerald [name]  has presented today for surgery, with the diagnosis of afib  The various methods of treatment have been discussed with the patient and family. After consideration of risks, benefits and other options for treatment, the patient has consented to  Procedure(s): CARDIOVERSION (N/A) as a surgical intervention .  The patient's history has been reviewed, patient examined, no change in status, stable for surgery.  I have reviewed the patient's chart and labs.  Questions were answered to the patient's satisfaction.   Note: pt has been on propofenone at 150 mg every 8 hrs and instead of taking Toprol XL 50 mg, he has only been taking 25 mg daily. He is on Eliquis for anticoagulation.   Lam Mccubbins A

## 2012-10-31 NOTE — Preoperative (Signed)
Beta Blockers   Reason not to administer Beta Blockers:Not Applicable 

## 2012-10-31 NOTE — Anesthesia Postprocedure Evaluation (Signed)
  Anesthesia Post-op Note  Patient: Gerald [name]  Procedure(s) Performed: Procedure(s): CARDIOVERSION (N/A)  Patient Location: Endoscopy Unit  Anesthesia Type:General  Level of Consciousness: awake, alert  and oriented  Airway and Oxygen Therapy: Patient Spontanous Breathing and Patient connected to nasal cannula oxygen  Post-op Pain: none  Post-op Assessment: Post-op Vital signs reviewed, Patient's Cardiovascular Status Stable, Respiratory Function Stable, Patent Airway and No signs of Nausea or vomiting  Post-op Vital Signs: Reviewed  Complications: No apparent anesthesia complications

## 2012-10-31 NOTE — H&P (View-Only) (Signed)
Patient ID: Gerald [name], male   DOB: 1926/10/28, 79 y.o.   MRN: [id number]       HPI: Gerald [name] is an 79 year old gentleman who I saw initially on 09/19/2012 30 courtesy of Dr. Benedetto Goad for evaluation of new onset atrial fibrillation.  He does have a history of type 2 diabetes mellitus, hyperlipidemia, hypertension, and at times has noticed some mild dizziness. On 08/20/2012 he was evaluated by Dr. Benedetto Goad with an irregular heart rhythm and was found to be in atrial fibrillation. When I initially saw him he denied any chest pain, presyncope or syncope. He denies wheezing. He had been taking metoprolol succinate 25 mg in addition to Prinzide 20/25 aspirin 81 mg Lipitor 20 mg in addition to his Amaryl and metformin for diabetes. At that time, I recommended further titration of his metoprolol succinate 50 mg daily and started on Eliquis 2.5 mg twice a day. A 2-D echo Doppler study which confirmed normal systolic function although he did not moderate concentric LVH. Ejection fraction was 55-60%. There was mild aortic valve sclerosis without stenosis and mild AR the head mild biatrial enlargement.  Gerald [name] inadvertently never did titrate his Toprol to 50 mg daily.  He states he has noticed that he has had some presyncopal-like spells after he had taken his Prinzide. When I saw him on his last evaluation, at that time I did titrate his Toprol to 50 mg daily and also started him on Propulsid on 150 mg every 8 hours. At that time I recommended he discontinue his Prinzide. He presents to the office today for followup evaluation.  Gerald [name] does feel improved. He feels his heart rate is controlled. He is now walking several miles per day and does note improved energy.   History reviewed. No pertinent past surgical history.  No Known Allergies  Current Outpatient Prescriptions  Medication Sig Dispense Refill  . apixaban (ELIQUIS) 2.5 MG TABS tablet Take 1 tablet (2.5 mg total) by mouth 2  (two) times daily.  60 tablet  5  . atorvastatin (LIPITOR) 20 MG tablet Take 20 mg by mouth daily.      . folic acid (FOLVITE) 1 MG tablet Take 1 mg by mouth daily.      Marland Kitchen glimepiride (AMARYL) 1 MG tablet Take 1 mg by mouth daily before breakfast.      . metFORMIN (GLUCOPHAGE) 1000 MG tablet Take 1,000 mg by mouth 2 (two) times daily with a meal.      . metoprolol succinate (TOPROL-XL) 25 MG 24 hr tablet Take 25 mg by mouth 2 (two) times daily.       . Multiple Vitamins-Minerals (PRESERVISION AREDS PO) Take 1 tablet by mouth 2 (two) times daily.      . propafenone (RYTHMOL) 150 MG tablet Take 1 tablet (150 mg total) by mouth every 8 (eight) hours.  30 tablet  6  . tamsulosin (FLOMAX) 0.4 MG CAPS capsule Take by mouth.       No current facility-administered medications for this visit.    Social history is notable in that he is married. He has one child 3 grandchildren and one great-grandchild. 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 He was in the [employer] from 1951 through 1955. He previously had smoked a pipe but quit 30 years ago. There is no alcohol use.  Family History  Problem Relation Age of Onset  . Heart disease Mother   .  Heart failure Mother     ROS is negative for fever chills or night sweats. He denies visual changes. He denies rash. He denies tremors. He denies any known thyroid problems. He denies chest pressure. He denies change in bowel or bladder habits. He denies bleeding. There is no claudication. He denies significant edema. Other 12 point system review is negative.  PE BP 136/70  Pulse 79  Ht 5\' 11"  (1.803 m)  Wt 160 lb 3.2 oz (72.666 kg)  BMI 22.35 kg/m2 General: Alert, oriented, no distress.  Skin: normal turgor, no rashes HEENT: Normocephalic, atraumatic. Pupils round and reactive; sclera anicteric; Fundi mild arteriolar narrowing. No hemorrhages or exudates. Nose without nasal septal  hypertrophy Mouth/Parynx benign; full dentures;  Mallinpatti scale 3 Neck: No JVD, no carotid briuts Lungs: clear to ausculatation and percussion; no wheezing or rales Heart: Irregularly irregular rhythm with an improved rate in the 70s beats per minute, 1/6 systolic murmur Abdomen: soft, nontender; no hepatosplenomehaly, BS+; abdominal aorta nontender and not dilated by palpation. Pulses 2+ Extremities: no clubbinbg cyanosis or edema, Homan's sign negative  Neurologic: grossly nonfocal    ECG: Atrial fibrillation at 79 beats per minute. No significant ST-T changes. QTc interval 415 ms.  LABS:  BMET    Component Value Date/Time   NA 142 09/30/2012 0839   K 4.1 09/30/2012 0839   CL 100 09/30/2012 0839   CO2 28 09/30/2012 0839   GLUCOSE 150* 09/30/2012 0839   BUN 21 09/30/2012 0839   CREATININE 1.09 09/30/2012 0839   CREATININE 1.0 03/10/2007 1215   CALCIUM 9.5 09/30/2012 0839     Hepatic Function Panel     Component Value Date/Time   PROT 7.4 09/30/2012 0839   ALBUMIN 4.1 09/30/2012 0839   AST 18 09/30/2012 0839   ALT 9 09/30/2012 0839   ALKPHOS 90 09/30/2012 0839   BILITOT 0.7 09/30/2012 0839   BILIDIR <0.1 03/10/2007 2045   IBILI NOT CALCULATED 03/10/2007 2045     CBC    Component Value Date/Time   WBC 5.5 09/30/2012 0839   RBC 4.77 09/30/2012 0839   HGB 12.8* 09/30/2012 0839   HCT 39.2 09/30/2012 0839   PLT 207 09/30/2012 0839   MCV 82.2 09/30/2012 0839   MCH 26.8 09/30/2012 0839   MCHC 32.7 09/30/2012 0839   RDW 14.5 09/30/2012 0839   LYMPHSABS 1.2 03/10/2007 1145   MONOABS 0.4 03/10/2007 1145   EOSABS 0.1 03/10/2007 1145   BASOSABS 0.0 03/10/2007 1145     BNP No results found for this basename: probnp    Lipid Panel     Component Value Date/Time   CHOL 131 09/30/2012 0839   TRIG 84 09/30/2012 0839   HDL 44 09/30/2012 0839   CHOLHDL 3.0 09/30/2012 0839   VLDL 17 09/30/2012 0839   LDLCALC 70 09/30/2012 0839     RADIOLOGY: No results found.   ASSESSMENT AND PLAN:  Mr.  Carmina [name] is an 78 year old white gentleman with a history of diabetes mellitus, hypertension, and hyperlipidemia. He has documented atrial fibrillation for at least several months when he was initially seen by Dr. Andrey Campanile the atrial fibrillation documented initially on 08/20/2012 when seen by Dr. Andrey Campanile. He has been on anticoagulation with eloquence 2.5 mg twice a day since I initially saw him on 09/19/2012. Today, his rate is significantly better controlled on his increased Toprol dose to 50 mg and with the addition of Toprol for now 150 mg every 8 hours. He has normal systolic function on  echo as noted above. After much discussion, we will now make plans to attempt outpatient DC cardioversion. His son will be out of town next week. For this reason, this will be scheduled to be done at [facility] the following week. Laboratory obtained the week of his planned cardioversion.   Lennette Bihari, MD, [facility] 10/16/2012 3:18 PM

## 2012-11-03 ENCOUNTER — Encounter (HOSPITAL_COMMUNITY): Payer: Self-pay | Admitting: Cardiovascular Disease

## 2012-11-10 NOTE — Progress Notes (Signed)
This is a pre procedure CXR. To be discussed at the time of the procedure.

## 2012-11-12 NOTE — Progress Notes (Signed)
Quick Note:  Patient has appointment on this Friday. Results to be discussed at the appointment. ______

## 2012-11-14 ENCOUNTER — Ambulatory Visit (INDEPENDENT_AMBULATORY_CARE_PROVIDER_SITE_OTHER): Payer: Medicare Other | Admitting: Cardiology

## 2012-11-14 ENCOUNTER — Encounter: Payer: Self-pay | Admitting: Cardiology

## 2012-11-14 VITALS — BP 160/70 | HR 47 | Ht 71.0 in | Wt 157.4 lb

## 2012-11-14 DIAGNOSIS — I10 Essential (primary) hypertension: Secondary | ICD-10-CM

## 2012-11-14 DIAGNOSIS — I4891 Unspecified atrial fibrillation: Secondary | ICD-10-CM

## 2012-11-14 DIAGNOSIS — E785 Hyperlipidemia, unspecified: Secondary | ICD-10-CM

## 2012-11-14 DIAGNOSIS — Z7901 Long term (current) use of anticoagulants: Secondary | ICD-10-CM

## 2012-11-14 NOTE — Assessment & Plan Note (Signed)
Initial BP was 160/70. Pt reports being nervous. His BP was rechecked after several minutes sitting in exam room. SBP had improved to 150. I have instructed him to continue with medications as directed. He takes Toprol BID. I have also advised him to check BP daily at home.

## 2012-11-14 NOTE — Assessment & Plan Note (Signed)
S/p DCCV 10/31/12. EKG today demonstrates sinus bradycardia. HR 47 bpm. He is asymptomatic and seems to be tolerating bradycardia. He denies any symptoms since his procedure. Continue Rythmol, Toprol and Eliquis for anticoagulation.

## 2012-11-14 NOTE — Patient Instructions (Signed)
Continue taking medications as directed. Follow-up with Dr. Tresa Endo in 3 months.

## 2012-11-14 NOTE — Progress Notes (Signed)
11/14/2012 Raelyn Number   [id number]  [id number]  Primary Physicia Pamelia Hoit, MD Primary Cardiologist: Dr. Tresa Endo  HPI:  The patient is a 79 y/o male, followed by Dr. Tresa Endo with a history of PAF, on Rythmol and Toprol, and takes Eliquis for stroke prophylaxis. He also has HTN and HLD. He is s/p DCCV by Dr. Tresa Endo on 10/31/12. He required 3 shocks and was converted to NSR at 200 J. Post procedure EKG demonstrated sinus bradycardia with a HR in the 50s. He was discharged home, the same day, and was instructed to resume his prescribed medications.   He returns today for 2 week post-procedural office follow-up. He is accompanied by his son. He reports that he has been doing well since that time and denies any symptoms, including palpiations, chest pain, SOB, dizziness, weakness or fatigue. He reports daily compliance with his medications. He continues to take Eliquis daily and denies any signs of abnormal bleeding.   His EKG today in the office shows sinus bradycardia with a HR of 47 bpm. He is completely asymptomatic.    Current Outpatient Prescriptions  Medication Sig Dispense Refill  . apixaban (ELIQUIS) 2.5 MG TABS tablet Take 1 tablet (2.5 mg total) by mouth 2 (two) times daily.  60 tablet  5  . atorvastatin (LIPITOR) 20 MG tablet Take 20 mg by mouth daily.      . folic acid (FOLVITE) 1 MG tablet Take 1 mg by mouth daily.      Marland Kitchen glimepiride (AMARYL) 1 MG tablet Take 1 mg by mouth daily before breakfast.      . metFORMIN (GLUCOPHAGE) 1000 MG tablet Take 1,000 mg by mouth 2 (two) times daily with a meal.      . metoprolol succinate (TOPROL-XL) 25 MG 24 hr tablet Take 25 mg by mouth 2 (two) times daily.       . Multiple Vitamins-Minerals (PRESERVISION AREDS PO) Take 1 tablet by mouth 2 (two) times daily.      . propafenone (RYTHMOL) 150 MG tablet Take 1 tablet (150 mg total) by mouth every 8 (eight) hours.  30 tablet  6  . tamsulosin (FLOMAX) 0.4 MG CAPS capsule Take by mouth.        No current facility-administered medications for this visit.    No Known Allergies  History   Social History  . Marital Status: Married    Spouse Name: N/A    Number of Children: N/A  . Years of Education: N/A   Occupational History  . Not on file.   Social History Main Topics  . Smoking status: Former Games developer  . Smokeless tobacco: Never Used     Comment: quit smoking about 35 years ago.  . Alcohol Use: No  . Drug Use: No  . Sexual Activity: Not on file   Other Topics Concern  . Not on file   Social History Narrative  . No narrative on file     Review of Systems: General: negative for chills, fever, night sweats or weight changes.  Cardiovascular: negative for chest pain, dyspnea on exertion, edema, orthopnea, palpitations, paroxysmal nocturnal dyspnea or shortness of breath Dermatological: negative for rash Respiratory: negative for cough or wheezing Urologic: negative for hematuria Abdominal: negative for nausea, vomiting, diarrhea, bright red blood per rectum, melena, or hematemesis Neurologic: negative for visual changes, syncope, or dizziness All other systems reviewed and are otherwise negative except as noted above.    Blood pressure 160/70, pulse 47, height 5\' 11"  (1.803 m), weight  157 lb 6.4 oz (71.396 kg).  General appearance: alert, cooperative and no distress Neck: no carotid bruit and no JVD Lungs: clear to auscultation bilaterally Heart: regular rhythm, bradycardic. Extremities: no LEE Pulses: 2+ and symmetric Skin: warm and dry Neurologic: Grossly normal  EKG Sinus Bradycardia. HR 47 bpm  ASSESSMENT AND PLAN:   Atrial fibrillation S/p DCCV 10/31/12. EKG today demonstrates sinus bradycardia. HR 47 bpm. He is asymptomatic and seems to be tolerating bradycardia. He denies any symptoms since his procedure. Continue Rythmol, Toprol and Eliquis for anticoagulation.  Hypertension Initial BP was 160/70. Pt reports being nervous. His BP was  rechecked after several minutes sitting in exam room. SBP had improved to 150. I have instructed him to continue with medications as directed. He takes Toprol BID. I have also advised him to check BP daily at home.   Hyperlipidemia Last lipid panel was 09/30/2012. LDL was elevated at 104. HDL was low at 24. Continue on Lipitor to decrease LDL. Pt encouraged to continue walking program to help increase HDL.  Chronic anticoagulation Continue Eliquis for stroke prophylaxis. He denies any abnormal bleeding.    PLAN   Mr. Brumbaugh returns after DCCV for atrial fibrillation approximately 2 weeks ago. He has done well since that time and has been without any symptoms. He continues in NSR. EKG shows HR of 47, but he seems to be tolerating the rate w/o difficulty. BP stable. He has been instructed to continue medications as directed. He will follow up with Dr. Tresa Endo in 3 months.   Allayne Butcher, PA-C 11/14/2012 3:20 PM

## 2012-11-14 NOTE — Assessment & Plan Note (Signed)
Continue Eliquis for stroke prophylaxis. He denies any abnormal bleeding.

## 2012-11-14 NOTE — Assessment & Plan Note (Signed)
Last lipid panel was 09/30/2012. LDL was elevated at 104. HDL was low at 24. Continue on Lipitor to decrease LDL. Pt encouraged to continue walking program to help increase HDL.

## 2012-12-12 ENCOUNTER — Other Ambulatory Visit: Payer: Self-pay | Admitting: Cardiovascular Disease

## 2012-12-12 NOTE — Telephone Encounter (Signed)
Rx was sent to pharmacy electronically. 

## 2012-12-23 ENCOUNTER — Encounter (HOSPITAL_COMMUNITY): Payer: Self-pay | Admitting: Emergency Medicine

## 2012-12-23 ENCOUNTER — Observation Stay (HOSPITAL_COMMUNITY)
Admission: EM | Admit: 2012-12-23 | Discharge: 2012-12-24 | Disposition: A | Discharge status: Home / Self Care | Payer: Medicare Other | Attending: Cardiovascular Disease | Admitting: Cardiovascular Disease

## 2012-12-23 DIAGNOSIS — I4821 Permanent atrial fibrillation: Secondary | ICD-10-CM | POA: Diagnosis present

## 2012-12-23 DIAGNOSIS — I4891 Unspecified atrial fibrillation: Secondary | ICD-10-CM

## 2012-12-23 DIAGNOSIS — R0602 Shortness of breath: Secondary | ICD-10-CM | POA: Insufficient documentation

## 2012-12-23 DIAGNOSIS — Z7901 Long term (current) use of anticoagulants: Secondary | ICD-10-CM

## 2012-12-23 DIAGNOSIS — Z87891 Personal history of nicotine dependence: Secondary | ICD-10-CM | POA: Insufficient documentation

## 2012-12-23 DIAGNOSIS — I495 Sick sinus syndrome: Secondary | ICD-10-CM

## 2012-12-23 DIAGNOSIS — R42 Dizziness and giddiness: Secondary | ICD-10-CM

## 2012-12-23 DIAGNOSIS — R55 Syncope and collapse: Principal | ICD-10-CM

## 2012-12-23 DIAGNOSIS — E785 Hyperlipidemia, unspecified: Secondary | ICD-10-CM | POA: Insufficient documentation

## 2012-12-23 DIAGNOSIS — E119 Type 2 diabetes mellitus without complications: Secondary | ICD-10-CM | POA: Diagnosis present

## 2012-12-23 DIAGNOSIS — I10 Essential (primary) hypertension: Secondary | ICD-10-CM

## 2012-12-23 DIAGNOSIS — J61 Pneumoconiosis due to asbestos and other mineral fibers: Secondary | ICD-10-CM | POA: Diagnosis present

## 2012-12-23 DIAGNOSIS — Z79899 Other long term (current) drug therapy: Secondary | ICD-10-CM | POA: Insufficient documentation

## 2012-12-23 HISTORY — DX: Syncope and collapse: R55

## 2012-12-23 HISTORY — DX: Paroxysmal atrial fibrillation: I48.0

## 2012-12-23 LAB — BASIC METABOLIC PANEL
BUN: 18 mg/dL (ref 6–23)
CO2: 28 mEq/L (ref 19–32)
Calcium: 9 mg/dL (ref 8.4–10.5)
Creatinine, Ser: 0.93 mg/dL (ref 0.50–1.35)
GFR calc Af Amer: 88 mL/min — ABNORMAL LOW (ref >90)
Glucose, Bld: 71 mg/dL (ref 70–99)
Sodium: 139 mEq/L (ref 135–145)

## 2012-12-23 LAB — CBC
HCT: 38 % — ABNORMAL LOW (ref 39.0–52.0)
Hemoglobin: 11.9 g/dL — ABNORMAL LOW (ref 13.0–17.0)
MCH: 27.9 pg (ref 26.0–34.0)
MCV: 89 fL (ref 78.0–100.0)
RBC: 4.27 MIL/uL (ref 4.22–5.81)
RDW: 15.2 % (ref 11.5–15.5)

## 2012-12-23 MED ORDER — GLIMEPIRIDE 1 MG PO TABS
1.0000 mg | ORAL_TABLET | Freq: Every day | ORAL | Status: DC
  Administered 2012-12-24: 1 mg via ORAL
  Filled 2012-12-23 (×2): qty 1

## 2012-12-23 MED ORDER — ACETAMINOPHEN 325 MG PO TABS: 650.0000 mg | ORAL_TABLET | ORAL | Status: DC | PRN

## 2012-12-23 MED ORDER — ATORVASTATIN CALCIUM 20 MG PO TABS
20.0000 mg | ORAL_TABLET | Freq: Every day | ORAL | Status: DC
  Filled 2012-12-23: qty 1

## 2012-12-23 MED ORDER — ONDANSETRON HCL 4 MG/2ML IJ SOLN: 4.0000 mg | Freq: Four times a day (QID) | INTRAMUSCULAR | Status: DC | PRN

## 2012-12-23 MED ORDER — METFORMIN HCL 500 MG PO TABS
1000.0000 mg | ORAL_TABLET | Freq: Two times a day (BID) | ORAL | Status: DC
  Administered 2012-12-24: 1000 mg via ORAL
  Filled 2012-12-23 (×3): qty 2

## 2012-12-23 MED ORDER — METOPROLOL SUCCINATE ER 50 MG PO TB24
50.0000 mg | ORAL_TABLET | Freq: Every day | ORAL | Status: DC
  Administered 2012-12-24: 50 mg via ORAL
  Filled 2012-12-23: qty 1

## 2012-12-23 MED ORDER — APIXABAN 2.5 MG PO TABS
2.5000 mg | ORAL_TABLET | Freq: Two times a day (BID) | ORAL | Status: DC
  Administered 2012-12-23 – 2012-12-24 (×2): 2.5 mg via ORAL
  Filled 2012-12-23 (×3): qty 1

## 2012-12-23 MED ORDER — TAMSULOSIN HCL 0.4 MG PO CAPS
0.4000 mg | ORAL_CAPSULE | Freq: Every day | ORAL | Status: DC
  Administered 2012-12-24: 0.4 mg via ORAL
  Filled 2012-12-23: qty 1

## 2012-12-23 NOTE — ED Notes (Signed)
Per EMS: Pt from home with reports of dizziness, near syncope when walking in his home. Pt found to be in controlled 110 A fib, hx of the same, recently cardioverted appx 2 months ago. Denies pain. Neuro intact. Neg orthostatics. 97%Ra. 152/98. CBG 103. Denies complaint at this time.

## 2012-12-23 NOTE — ED Provider Notes (Signed)
CSN: [id number]     Arrival date & time 12/23/12  1028 History   First MD Initiated Contact with Patient 12/23/12 1036     Chief Complaint  Patient presents with  . Near Syncope  . Atrial Fibrillation   (Consider location/radiation/quality/duration/timing/severity/associated sxs/prior Treatment) HPI Patient reports about 2 months ago he was having shortness of breath and dizziness and he was found to be in atrial fibrillation. He underwent cardioversion by Dr. Tresa Endo. Yesterday he worked hard and did a lot of raking of leaves. This morning after eating his breakfast he was walking back to the bathroom for his second episode of BM and he got acutely dizzy like he was going to pass out.He denies chest pain, palpitations, shortness of breath, nausea, or vomiting. His wife states his car was normal. She thinks maybe he was mildly clammy. He denies diarrhea or straining today.    PCP Dr Kelby Fam Cardiology Dr Tresa Endo  Past Medical History  Diagnosis Date  . Hypertension   . Dysrhythmia   . Diabetes mellitus without complication   . Hyperlipidemia   . Near syncope 12/23/2012  . PAF (paroxysmal atrial fibrillation)    Past Surgical History  Procedure Laterality Date  . No past surgeries    . Cardioversion N/A 10/31/2012    Procedure: CARDIOVERSION;  Surgeon: Lennette Bihari, MD;  Location: [facility] ENDOSCOPY;  Service: Cardiovascular;  Laterality: N/A;   Family History  Problem Relation Age of Onset  . Heart disease Mother   . Heart failure Mother    History  Substance Use Topics  . Smoking status: Former Games developer  . Smokeless tobacco: Never Used     Comment: quit smoking about 35 years ago.  . Alcohol Use: No  lives at home Lives with spouse  Review of Systems  All other systems reviewed and are negative.    Allergies  Review of patient's allergies indicates no known allergies.  Home Medications   Current Outpatient Rx  Name  Route  Sig  Dispense  Refill  . apixaban (ELIQUIS)  2.5 MG TABS tablet   Oral   Take 1 tablet (2.5 mg total) by mouth 2 (two) times daily.   60 tablet   5   . atorvastatin (LIPITOR) 20 MG tablet   Oral   Take 20 mg by mouth daily.         . folic acid (FOLVITE) 1 MG tablet   Oral   Take 1 mg by mouth daily.         Marland Kitchen glimepiride (AMARYL) 1 MG tablet   Oral   Take 1 mg by mouth daily before breakfast.         . metFORMIN (GLUCOPHAGE) 1000 MG tablet   Oral   Take 1,000 mg by mouth 2 (two) times daily with a meal.         . metoprolol succinate (TOPROL-XL) 25 MG 24 hr tablet   Oral   Take 25 mg by mouth 2 (two) times daily.          . Multiple Vitamins-Minerals (PRESERVISION AREDS PO)   Oral   Take 1 tablet by mouth 2 (two) times daily.         . propafenone (RYTHMOL) 150 MG tablet      TAKE 1 TABLET BY MOUTH EVERY 8 HOURS   90 tablet   5   . tamsulosin (FLOMAX) 0.4 MG CAPS capsule   Oral   Take by mouth.  BP 160/125  Pulse 53  Temp(Src) 98.2 F (36.8 C) (Oral)  Resp 15  SpO2 99%  Vital signs normal except bradycardia  Physical Exam  Nursing note and vitals reviewed. Constitutional: He is oriented to person, place, and time. He appears well-developed and well-nourished.  Non-toxic appearance. He does not appear ill. No distress.  HENT:  Head: Normocephalic and atraumatic.  Right Ear: External ear normal.  Left Ear: External ear normal.  Nose: Nose normal. No mucosal edema or rhinorrhea.  Mouth/Throat: Oropharynx is clear and moist and mucous membranes are normal. No dental abscesses or uvula swelling.  Eyes: Conjunctivae and EOM are normal. Pupils are equal, round, and reactive to light.  Neck: Normal range of motion and full passive range of motion without pain. Neck supple.  Cardiovascular: Normal rate and normal heart sounds.  An irregularly irregular rhythm present. Exam reveals no gallop and no friction rub.   No murmur heard. Pulmonary/Chest: Effort normal and breath sounds  normal. No respiratory distress. He has no wheezes. He has no rhonchi. He has no rales. He exhibits no tenderness and no crepitus.  Abdominal: Soft. Normal appearance and bowel sounds are normal. He exhibits no distension. There is no tenderness. There is no rebound and no guarding.  Musculoskeletal: Normal range of motion. He exhibits no edema and no tenderness.  Moves all extremities well.   Neurological: He is alert and oriented to person, place, and time. He has normal strength. No cranial nerve deficit.  Skin: Skin is warm, dry and intact. No rash noted. No erythema. No pallor.  Psychiatric: He has a normal mood and affect. His speech is normal and behavior is normal. His mood appears not anxious.    ED Course  Procedures (including critical care time)  Medications - No data to display  Pt remains in afib with controlled rate in 80's sometimes gets 104. Meds were not given because his rate is controlled.   15:00 Jesse Sans will have Dr Landry Dyke PA call me back.   Labs Review Results for orders placed during the hospital encounter of 12/23/12  CBC      Result Value Range   WBC 4.4  4.0 - 10.5 K/uL   RBC 4.27  4.22 - 5.81 MIL/uL   Hemoglobin 11.9 (*) 13.0 - 17.0 g/dL   HCT 40.9 (*) 81.1 - 91.4 %   MCV 89.0  78.0 - 100.0 fL   MCH 27.9  26.0 - 34.0 pg   MCHC 31.3  30.0 - 36.0 g/dL   RDW 78.2  95.6 - 21.3 %   Platelets 163  150 - 400 K/uL  BASIC METABOLIC PANEL      Result Value Range   Sodium 139  135 - 145 mEq/L   Potassium 4.9  3.5 - 5.1 mEq/L   Chloride 104  96 - 112 mEq/L   CO2 28  19 - 32 mEq/L   Glucose, Bld 71  70 - 99 mg/dL   BUN 18  6 - 23 mg/dL   Creatinine, Ser 0.86  0.50 - 1.35 mg/dL   Calcium 9.0  8.4 - 57.8 mg/dL   GFR calc non Af Amer 76 (*) >90 mL/min   GFR calc Af Amer 88 (*) >90 mL/min  TROPONIN I      Result Value Range   Troponin I <0.30  <0.30 ng/mL   Laboratory interpretation all normal    Imaging Review No results found.  EKG  Interpretation    Date/Time:  Tuesday  December 23 2012 10:40:58 EST Ventricular Rate:  99 PR Interval:    QRS Duration: 107 QT Interval:  357 QTC Calculation: 458 R Axis:   93 Text Interpretation:  Age not entered, assumed to be  79 years old for purpose of ECG interpretation Atrial fibrillation Ventricular premature complex Borderline right axis deviation Probable anteroseptal infarct, old Since last tracing rate faster Atrial fibrillation has replaced Sinus bradycardia Confirmed by Sadia Belfiore  MD-I, Maddox Hlavaty ([id number]) on 12/23/2012 10:46:07 AM            MDM   1. Atrial fibrillation   2. Dizziness   3. Near syncope    Disposition per Cardiology  Devoria Albe, MD, Franz Dell, MD 12/23/12 [phone]

## 2012-12-23 NOTE — ED Notes (Signed)
Patient moved to C26 without any incident. Hooked patient up to monitor and explained to the patient and family that cardiology will be seeing him there.

## 2012-12-23 NOTE — H&P (Signed)
Gerald [name] is an 79 y.o. male.    Primary Cardiologist:Dr. Tresa Endo  ZOX:WRUEAV,WUJW [name], MD  Chief Complaint: near syncope HPI: 79 y/o male, followed by Dr. Tresa Endo with a history of PAF, on Rythmol and Toprol, and takes Eliquis for stroke prophylaxis. He also has HTN and HLD. He is s/p DCCV by Dr. Tresa Endo on 10/31/12. He required 3 shocks and was converted to NSR at 200 J. Post procedure EKG demonstrated sinus bradycardia with a HR in the 50s. He was discharged home, the same day, and was instructed to resume his prescribed medications.  On follow up in the office for post-procedural check. He had no complaints.  His EKG in the office showed sinus bradycardia with a HR of 47 bpm. He was completely asymptomatic.    Today he was in his usual state of health and after using bathroom, was walking down hall and became dizzy/lightheaded and had to hold onto the wall.  He called for his wife as everything was becoming "blank" and she helped him to the sofa.  He felt he would have passed out if he had not rec'd help to lie down.  On EMS arrival he was in Afib with RVR at 110.   Pt denies any chest pain- did have SOB with the dizziness.  No nausea or vomiting.  Currently in a fib no complaints.  He confirms he has been taking meds appropriately.   EKG a fib rate 99 no acute changes except for a fib. Troponin neg.  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 No acute cardiopulmonary process identified.     When a fib was first diagnosed he was asymptomatic with pulse of 117.  A fib was found on routine exam. Echo 09/29/12: Left ventricle: The cavity size was normal. There wassevere focal basal septal and moderate concentric hypertrophy of the ventricle. Systolic function was normal. The estimated ejection fraction was in the range of 55% to 60%. The study is not technically sufficient to allow evaluation of LV diastolic function. -  Aortic valve: Sclerosis without stenosis. Mild regurgitation. - Mitral valve: Calcified annulus. Mildly thickened leaflets . Mild regurgitation. - Left atrium: LA Volume/ BSA = 33. 2 ml/m2 The atrium was mildly dilated. - Right atrium: The atrium was mildly dilated. - Atrial septum: No defect or patent foramen ovale was identified. - Inferior vena cava: The vessel was normal in size; the respirophasic diameter changes were in the normal range (= 50%); findings are consistent with normal central venous pressure. - Pericardium, extracardiac: There was no pericardial effusion.   Past Medical History  Diagnosis Date  . Hypertension   . Dysrhythmia   . Diabetes mellitus without complication   . Hyperlipidemia   . Near syncope 12/23/2012  . PAF (paroxysmal atrial fibrillation)     Past Surgical History  Procedure Laterality Date  . No past surgeries    . Cardioversion N/A 10/31/2012    Procedure: CARDIOVERSION;  Surgeon: Lennette Bihari, MD;  Location: [facility] ENDOSCOPY;  Service: Cardiovascular;  Laterality: N/A;    Family History  Problem Relation Age of Onset  . Heart disease Mother   . Heart failure Mother    Social History:  reports that he has quit smoking. He has never used smokeless tobacco. He reports that he does not drink alcohol or use illicit drugs.  Allergies: No Known Allergies  OUTPATIENT MEDICATIONS: Eliquis 2.5 mg BID Folic acid  1 mg daily amaryl 1 mg daily Metformin 1000mg  BID flomaz 0.4 mg daily lipitor 20 mg daily MVI daily Toprol XL 50 mg daily Rythmol 150 mg every 8 hours.     Results for orders placed during the hospital encounter of 12/23/12 (from the past 48 hour(s))  CBC     Status: Abnormal   Collection Time    12/23/12 11:03 AM      Result Value Range   WBC 4.4  4.0 - 10.5 K/uL   RBC 4.27  4.22 - 5.81 MIL/uL   Hemoglobin 11.9 (*) 13.0 - 17.0 g/dL   HCT 47.8 (*) 29.5 - 62.1 %   MCV 89.0  78.0 - 100.0 fL   MCH 27.9  26.0 - 34.0 pg    MCHC 31.3  30.0 - 36.0 g/dL   RDW 30.8  65.7 - 84.6 %   Platelets 163  150 - 400 K/uL  BASIC METABOLIC PANEL     Status: Abnormal   Collection Time    12/23/12 11:03 AM      Result Value Range   Sodium 139  135 - 145 mEq/L   Potassium 4.9  3.5 - 5.1 mEq/L   Chloride 104  96 - 112 mEq/L   CO2 28  19 - 32 mEq/L   Glucose, Bld 71  70 - 99 mg/dL   BUN 18  6 - 23 mg/dL   Creatinine, Ser 9.62  0.50 - 1.35 mg/dL   Calcium 9.0  8.4 - 95.2 mg/dL   GFR calc non Af Amer 76 (*) >90 mL/min   GFR calc Af Amer 88 (*) >90 mL/min   Comment: (NOTE)     The eGFR has been calculated using the CKD EPI equation.     This calculation has not been validated in all clinical situations.     eGFR's persistently <90 mL/min signify possible Chronic Kidney     Disease.  TROPONIN I     Status: None   Collection Time    12/23/12 12:38 PM      Result Value Range   Troponin I <0.30  <0.30 ng/mL   Comment:            Due to the release kinetics of cTnI,     a negative result within the first hours     of the onset of symptoms does not rule out     myocardial infarction with certainty.     If myocardial infarction is still suspected,     repeat the test at appropriate intervals.   No results found.  ROS: General:no colds or fevers, no weight changes Skin:no rashes or ulcers HEENT:no blurred vision, + AM congestion and runny nose at times CV:see HPI PUL:see HPI GI:no diarrhea constipation or melena, no indigestion GU:no hematuria, no dysuria MS:no joint pain, no claudication Neuro:+ near syncope and + lightheadedness today, memory has been mildly diminished over months Endo:+ diabetes, no thyroid disease   Blood pressure 118/72, pulse 78, temperature 98.2 F (36.8 C), temperature source Oral, resp. rate 18, SpO2 96.00%. PE: General:Pleasant affect, NAD Skin:Warm and dry, brisk capillary refill HEENT:normocephalic, sclera clear, mucus membranes moist Neck:supple, no JVD, no bruits  Heart:irreg irreg  without murmur, gallup, rub or click Lungs:clear without rales, rhonchi, or wheezes WUX:LKGM, non tender, + BS, do not palpate liver spleen or masses Ext:no lower ext edema, 2+ pedal pulses, 2+ radial pulses Neuro:alert and oriented, MAE, follows commands, + facial symmetry with frown and smile, tongue midline.  bil grips strong and equal.  Push pull of feet equal and strong.      Assessment/Plan Principal Problem:   Near syncope Active Problems:   PAF (paroxysmal atrial fibrillation), recurrent today with DCCV 10/2012   DM2 (diabetes mellitus, type 2)   Hypertension   Chronic anticoagulation   Tachy-brady syndrome  PLAN: Pt has failed Rythmol therapy. He has tachy brady syndrome.  Not sure why he was symptomatic with this episode of a fib and not before.  MD to see and decide treatment, would not increase rythmol with HR 49 when in SR.    [facility] R Nurse Practitioner Certified [facility] Pager [phone] or after 5pm or weekends call [phone] 12/23/2012, 3:38 PM  I have seen and evaluated the patient this PM along with Nada Boozer, NP. I agree with her findings, examination as well as impression recommendations.  Very pleasant elderly man with recent Dx of PAF - s/p DCCV & on Propafenone.  Had a very active day yesterday, feeling quite well - raked the entire yard.  Today, on second consecutive trip to the bathroom noted lightheadedness & dizziness, like he was "going out" while walking back down the hallway.  He never lost consciousness.    Tele & ECG confirm Afib - initially HR in 110s, currently in 80-90s.    BP stable & he feels well.  Exam notable for irregular-irregular rhythm.  I am concerned about an elderly gentleman on Eliquis presenting with near syncope -- back in Afib, so essentially failed Propafenone.  I am more concerned with Bradycardia / pauses as the potential etiology, than tachy Afib.    I would like to observe him o/n for  bradycardia/pauses.  Will not order Propafenone, but continue BB dose for now.   If remains stable, will see how symptomatic he is in Afib -- ? If rate control would suffice.   If no pauses or significant bradycardia is noted, would d/c with MCOT.    Continue other home meds.  Marykay Lex, M.D., M.S. [facility] [address] [place], [place]  [address]  [phone] Pager # [phone] 12/23/2012 5:34 PM

## 2012-12-23 NOTE — ED Notes (Addendum)
Pt reports when he got up this morning he was feeling lightheaded, then after breakfast he went to the bathroom, afterwards he felt weak and had to hold onto the wall, he tried to sit down and had his wife assist him because he felt like he was going to pass out. Pt sts up until then he had been feeling fine. sts the only thing he has noticed is that he has been feeling nervous, he thinks it may be from his new medicine.sts yesterday he was working hard in the yard and may have over done it. sts recently saw his PCP and was told his heart rate was irregular and went to cardiologist and they cardioverted him. Then placed on new medicine. Nad, skin warm and dry, resp e/u. Denies LOC/hitting head.

## 2012-12-23 NOTE — ED Notes (Signed)
Cardiology at bedside evaluating patient.

## 2012-12-23 NOTE — ED Notes (Signed)
Attempted to call report. Spoke to [employer] . Lupita Leash advises the patients nurse is doing something with blood and the charge nurse sayes to take a number. Asked if there was a "buddy " and donna advises the charge nurse sayes to get a phone number, and is aware room has been assigned 15 minutes

## 2012-12-24 ENCOUNTER — Ambulatory Visit (INDEPENDENT_AMBULATORY_CARE_PROVIDER_SITE_OTHER): Payer: Medicare Other | Admitting: *Deleted

## 2012-12-24 DIAGNOSIS — J61 Pneumoconiosis due to asbestos and other mineral fibers: Secondary | ICD-10-CM | POA: Diagnosis present

## 2012-12-24 DIAGNOSIS — I4891 Unspecified atrial fibrillation: Secondary | ICD-10-CM

## 2012-12-24 LAB — CBC
HCT: 33.8 % — ABNORMAL LOW (ref 39.0–52.0)
MCH: 28 pg (ref 26.0–34.0)
MCHC: 32 g/dL (ref 30.0–36.0)
MCV: 87.6 fL (ref 78.0–100.0)
Platelets: 153 10*3/uL (ref 150–400)
RBC: 3.86 MIL/uL — ABNORMAL LOW (ref 4.22–5.81)
RDW: 15 % (ref 11.5–15.5)
WBC: 4 10*3/uL (ref 4.0–10.5)

## 2012-12-24 LAB — GLUCOSE, CAPILLARY: Glucose-Capillary: 128 mg/dL — ABNORMAL HIGH (ref 70–99)

## 2012-12-24 MED ORDER — ACETAMINOPHEN 325 MG PO TABS: 650.0000 mg | ORAL_TABLET | ORAL | Status: DC | PRN

## 2012-12-24 NOTE — Discharge Summary (Signed)
Patient ID: Gerald [name],  MRN: [id number], DOB/AGE: Jan 25, 1926 79 y.o.  Admit date: 12/23/2012 Discharge date: 12/24/2012  Primary Care Provider: Dr Benedetto Goad Primary Cardiologist: Dr Bishop Limbo  Discharge Diagnoses Principal Problem:   Near syncope 12/23/12 - back in AF with VR 45-85 Active Problems:   Tachy-brady syndrome   DM2 (diabetes mellitus, type 2)   PAF- DCCV 10/2012 to NSR on Rythmol   Hyperlipidemia   Hypertension   Chronic anticoagulation   Asbestosis by CXR     Hospital Course:  79 y/o male, first seen by Dr. Tresa Endo in Sept 2014 for PAF. He was treated Rythmol and Toprol, and Eliquis for stroke prophylaxis. He is s/p DCCV by Dr. Tresa Endo on 10/31/12. He required 3 shocks and was converted to NSR at 200 J. Post procedure EKG demonstrated sinus bradycardia with a HR in the 50s. He was seen in the office 11/14/12 and was noted to be bradycardic with a HR of 47, but asymptomatic.     He was admitted through the ER 12/23/12 after a near syncopal spell at home. In the ER he was noted to be in AF with VR of 48. He was seen by Dr Herbie Baltimore and admitted for observation. Dr Herbie Baltimore continued his Toprol 50 mg daily but stopped his Rythmol. We will see how he does in AF. He was monitored overnight and did well. Dr Rennis Golden saw him the morning of the 27th and feels he can be discharged with plans for an OP monitor and then follow up with Dr Tresa Endo.    Discharge Vitals:  Blood pressure 147/77, pulse 86, temperature 98.3 F (36.8 C), temperature source Oral, resp. rate 18, height 5\' 11"  (1.803 m), weight 161 lb 12.5 oz (73.384 kg), SpO2 97.00%.    Labs: Results for orders placed during the hospital encounter of 12/23/12 (from the past 48 hour(s))  CBC     Status: Abnormal   Collection Time    12/23/12 11:03 AM      Result Value Range   WBC 4.4  4.0 - 10.5 K/uL   RBC 4.27  4.22 - 5.81 MIL/uL   Hemoglobin 11.9 (*) 13.0 - 17.0 g/dL   HCT 40.9 (*) 81.1 - 91.4 %   MCV 89.0  78.0 -  100.0 fL   MCH 27.9  26.0 - 34.0 pg   MCHC 31.3  30.0 - 36.0 g/dL   RDW 78.2  95.6 - 21.3 %   Platelets 163  150 - 400 K/uL  BASIC METABOLIC PANEL     Status: Abnormal   Collection Time    12/23/12 11:03 AM      Result Value Range   Sodium 139  135 - 145 mEq/L   Potassium 4.9  3.5 - 5.1 mEq/L   Chloride 104  96 - 112 mEq/L   CO2 28  19 - 32 mEq/L   Glucose, Bld 71  70 - 99 mg/dL   BUN 18  6 - 23 mg/dL   Creatinine, Ser 0.86  0.50 - 1.35 mg/dL   Calcium 9.0  8.4 - 57.8 mg/dL   GFR calc non Af Amer 76 (*) >90 mL/min   GFR calc Af Amer 88 (*) >90 mL/min   Comment: (NOTE)     The eGFR has been calculated using the CKD EPI equation.     This calculation has not been validated in all clinical situations.     eGFR's persistently <90 mL/min signify possible Chronic Kidney  Disease.  TROPONIN I     Status: None   Collection Time    12/23/12 12:38 PM      Result Value Range   Troponin I <0.30  <0.30 ng/mL   Comment:            Due to the release kinetics of cTnI,     a negative result within the first hours     of the onset of symptoms does not rule out     myocardial infarction with certainty.     If myocardial infarction is still suspected,     repeat the test at appropriate intervals.  CBC     Status: Abnormal   Collection Time    12/24/12  4:17 AM      Result Value Range   WBC 4.0  4.0 - 10.5 K/uL   RBC 3.86 (*) 4.22 - 5.81 MIL/uL   Hemoglobin 10.8 (*) 13.0 - 17.0 g/dL   HCT 16.1 (*) 09.6 - 04.5 %   MCV 87.6  78.0 - 100.0 fL   MCH 28.0  26.0 - 34.0 pg   MCHC 32.0  30.0 - 36.0 g/dL   RDW 40.9  81.1 - 91.4 %   Platelets 153  150 - 400 K/uL  GLUCOSE, CAPILLARY     Status: Abnormal   Collection Time    12/24/12  8:02 AM      Result Value Range   Glucose-Capillary 121 (*) 70 - 99 mg/dL   Comment 1 Documented in Chart    GLUCOSE, CAPILLARY     Status: Abnormal   Collection Time    12/24/12 11:10 AM      Result Value Range   Glucose-Capillary 128 (*) 70 - 99 mg/dL    Comment 1 Documented in Chart      Disposition:  Follow-up Information   Follow up with Nicki Guadalajara A, MD. (office will call you)    Specialty:  Cardiology   Contact information:   [address] [place] [address] [phone]       Discharge Medications:    Medication List    STOP taking these medications       propafenone 150 MG tablet  Commonly known as:  RYTHMOL      TAKE these medications       acetaminophen 325 MG tablet  Commonly known as:  TYLENOL  Take 2 tablets (650 mg total) by mouth every 4 (four) hours as needed for headache or mild pain.     apixaban 2.5 MG Tabs tablet  Commonly known as:  ELIQUIS  Take 1 tablet (2.5 mg total) by mouth 2 (two) times daily.     atorvastatin 20 MG tablet  Commonly known as:  LIPITOR  Take 20 mg by mouth daily.     folic acid 1 MG tablet  Commonly known as:  FOLVITE  Take 1 mg by mouth daily.     glimepiride 1 MG tablet  Commonly known as:  AMARYL  Take 1 mg by mouth daily before breakfast.     metFORMIN 1000 MG tablet  Commonly known as:  GLUCOPHAGE  Take 1,000 mg by mouth 2 (two) times daily with a meal.     metoprolol succinate 50 MG 24 hr tablet  Commonly known as:  TOPROL-XL  Take 50 mg by mouth daily. Take with or immediately following a meal.     PRESERVISION AREDS PO  Take 1 tablet by mouth 2 (two) times daily.  tamsulosin 0.4 MG Caps capsule  Commonly known as:  FLOMAX  Take by mouth.         Duration of Discharge Encounter: Greater than 30 minutes including physician time.  Jolene Provost PA-C 12/24/2012 12:42 PM

## 2012-12-24 NOTE — Progress Notes (Signed)
Pt ambulated in hallway without difficulty. Denied CP/SOB/lightheadness/dizziness. HR afib in 90s. Will continue to monitor. Levonne Spiller, RN

## 2012-12-24 NOTE — Progress Notes (Signed)
Subjective:  Up without problems.   Objective:  Vital Signs in the last 24 hours: Temp:  [98.2 F (36.8 C)-98.5 F (36.9 C)] 98.3 F (36.8 C) (12/17 0500) Pulse Rate:  [53-137] 137 (12/17 0500) Resp:  [12-24] 18 (12/17 0500) BP: (101-160)/(66-125) 147/77 mmHg (12/17 0500) SpO2:  [94 %-100 %] 97 % (12/17 0500) Weight:  [161 lb 12.5 oz (73.384 kg)-162 lb (73.483 kg)] 161 lb 12.5 oz (73.384 kg) (12/17 0500)  Intake/Output from previous day:  Intake/Output Summary (Last 24 hours) at 12/24/12 0900 Last data filed at 12/24/12 0827  Gross per 24 hour  Intake    360 ml  Output    350 ml  Net     10 ml    Physical Exam: General appearance: alert, cooperative and no distress Lungs: clear to auscultation bilaterally Heart: irregularly irregular rhythm   Rate: 45-120  Rhythm: atrial fibrillation  Lab Results:  Recent Labs  12/23/12 1103 12/24/12 0417  WBC 4.4 4.0  HGB 11.9* 10.8*  PLT 163 153    Recent Labs  12/23/12 1103  NA 139  K 4.9  CL 104  CO2 28  GLUCOSE 71  BUN 18  CREATININE 0.93    Recent Labs  12/23/12 1238  TROPONINI <0.30   No results found for this basename: INR,  in the last 72 hours  Imaging: Imaging results have been reviewed  Cardiac Studies:  Assessment/Plan:   Principal Problem:   Near syncope 12/23/12 - back in AF with VR 45-85 Active Problems:   Tachy-brady syndrome   DM2 (diabetes mellitus, type 2)   PAF- DCCV 10/2012 to NSR on Rythmol   Hyperlipidemia   Hypertension   Chronic anticoagulation   Asbestosis by CXR    PLAN: Ambulate today- will see how his HR responds on beta blocker and off Rythmol. Possible discharge later today with plans for OP monitor if HR stable.   Corine Shelter PA-C Beeper [phone] 12/24/2012, 9:00 AM

## 2012-12-24 NOTE — Progress Notes (Signed)
Pt. Seen and examined. Agree with the NP/PA-C note as written.  Feels well. HR improved into the 90's currently on b-blocker. Agree with outpatient monitor to look for pauses, symptomatic bradycardia or other indications for possible pacemaker. D/c home today. May be able to pick up monitor in the office if available, otherwise, can probably have it mailed to him. Follow-up with Dr. Tresa Endo after monitoring period ends.  Chrystie Nose, MD, [facility] Attending Cardiologist [facility]

## 2012-12-24 NOTE — Progress Notes (Signed)
Pt provided with dc instructions and education. Pt verbalized understanding. Pt has no questions at this time. IV removed with tip intact. Hear tmonitor cleaned and returned to front. Brunilda Eble, RN 

## 2012-12-24 NOTE — Progress Notes (Signed)
Pt in for 14-day Cardiac Event Monitor.  Monitor hooked up and instructions given.  Pt to call [employer] for questions and supplies for monitor  Pt verbalized understanding and agreed w/ plan.  Pt/Son discharged w/ kit to scheduling to schedule f/u appt.

## 2012-12-29 ENCOUNTER — Telehealth: Payer: Self-pay | Admitting: Cardiovascular Disease

## 2012-12-29 NOTE — Telephone Encounter (Signed)
Message forwarded to Medical Records to print and fax Hospital Discharge Summary from 12.17.14 [facility] [place], [place]) and H&P from 12.16.14 (Dr. Herbie Baltimore).

## 2012-12-29 NOTE — Telephone Encounter (Signed)
Need clinical documentation. °

## 2013-01-09 ENCOUNTER — Encounter: Payer: Self-pay | Admitting: Cardiology

## 2013-01-09 ENCOUNTER — Ambulatory Visit (INDEPENDENT_AMBULATORY_CARE_PROVIDER_SITE_OTHER): Payer: Medicare HMO | Admitting: Cardiology

## 2013-01-09 VITALS — BP 122/80 | HR 108 | Ht 71.0 in | Wt 157.0 lb

## 2013-01-09 DIAGNOSIS — Z7901 Long term (current) use of anticoagulants: Secondary | ICD-10-CM

## 2013-01-09 DIAGNOSIS — I495 Sick sinus syndrome: Secondary | ICD-10-CM

## 2013-01-09 DIAGNOSIS — I4891 Unspecified atrial fibrillation: Secondary | ICD-10-CM

## 2013-01-09 DIAGNOSIS — I4821 Permanent atrial fibrillation: Secondary | ICD-10-CM

## 2013-01-09 NOTE — Assessment & Plan Note (Signed)
No syncope or pre syncope 

## 2013-01-09 NOTE — Progress Notes (Signed)
01/09/2013 Gerald [name]   1926-01-21  [id number]  Primary Physicia Gerald Hoit, MD Primary Cardiologist: Dr Gerald [name]  HPI:   80 y/o male, first seen by Dr. Tresa [name] in Sept 2014 for PAF. He was treated Rythmol and Toprol, and Eliquis for stroke prophylaxis. He is s/p DCCV by Dr. Tresa [name] on 10/31/12. He required 3 shocks and was converted to NSR at 200 J. Post procedure EKG demonstrated sinus bradycardia with a HR in the 50s. He was seen in the office 11/14/12 and was noted to be bradycardic with a HR of 47, but asymptomatic.           He was admitted through the ER 12/23/12 after a near syncopal spell at home. In the ER he was noted to be in AF with VR of 48. He was seen by Dr Gerald [name] and admitted for observation. Dr Gerald [name] continued his Toprol 50 mg daily but stopped his Rythmol. He is seen now in follow up. The pt says he has been walking 1-2 miles a day without problem. He denies any syncope or near syncope. His monitor showed AF with pauses up to 2.8 seconds.    Current Outpatient Prescriptions  Medication Sig Dispense Refill  . acetaminophen (TYLENOL) 325 MG tablet Take 2 tablets (650 mg total) by mouth every 4 (four) hours as needed for headache or mild pain.      Marland Kitchen apixaban (ELIQUIS) 2.5 MG TABS tablet Take 1 tablet (2.5 mg total) by mouth 2 (two) times daily.  60 tablet  5  . atorvastatin (LIPITOR) 20 MG tablet Take 20 mg by mouth daily.      . folic acid (FOLVITE) 1 MG tablet Take 1 mg by mouth daily.      Marland Kitchen glimepiride (AMARYL) 1 MG tablet Take 1 mg by mouth daily before breakfast.      . metFORMIN (GLUCOPHAGE) 1000 MG tablet Take 1,000 mg by mouth 2 (two) times daily with a meal.      . metoprolol succinate (TOPROL-XL) 50 MG 24 hr tablet Take 50 mg by mouth daily. Take with or immediately following a meal.      . Multiple Vitamins-Minerals (PRESERVISION AREDS PO) Take 1 tablet by mouth 2 (two) times daily.      . tamsulosin (FLOMAX) 0.4 MG CAPS capsule Take by mouth.       No  current facility-administered medications for this visit.    No Known Allergies  History   Social History  . Marital Status: Married    Spouse Name: N/A    [name] of Children: N/A  . Years of Education: N/A   Occupational History  . Not on file.   Social History Main Topics  . Smoking status: Former Games developer  . Smokeless tobacco: Never Used     Comment: quit smoking about 35 years ago.  . Alcohol Use: No  . Drug Use: No  . Sexual Activity: Not on file   Other Topics Concern  . Not on file   Social History Narrative  . No narrative on file     Review of Systems: General: negative for chills, fever, night sweats or weight changes.  Cardiovascular: negative for chest pain, dyspnea on exertion, edema, orthopnea, palpitations, paroxysmal nocturnal dyspnea or shortness of breath Dermatological: negative for rash Respiratory: negative for cough or wheezing Urologic: negative for hematuria Abdominal: negative for nausea, vomiting, diarrhea, bright red blood per rectum, melena, or hematemesis Neurologic: negative for visual changes, syncope, or dizziness All other systems reviewed  and are otherwise negative except as noted above.    Blood pressure 122/80, pulse 108, height 5\' 11"  (1.803 m), weight 157 lb (71.215 kg).  General appearance: alert, cooperative and no distress Lungs: clear to auscultation bilaterally Heart: irregularly irregular rhythm  EKG AF with VR 90  ASSESSMENT AND PLAN:   Permanent atrial fibrillation- Rythmol stopped Review of his two week event showed overall controlled rates with pauses less than 3 seconds.  Tachy-brady syndrome No syncope or pre syncope  Chronic anticoagulation .   PLAN  Same Rx for now. Follow up with Dr Gerald EndoKelly in 3 months or sooner if he starts to have symtoms.   Gerald [name] KPA-C 01/09/2013 11:14 AM

## 2013-01-09 NOTE — Patient Instructions (Signed)
Your physician recommends that you schedule a follow-up appointment in: 3 Months with Dr Kelly  

## 2013-01-09 NOTE — Assessment & Plan Note (Signed)
Review of his two week event showed overall controlled rates with pauses less than 3 seconds.

## 2013-01-21 ENCOUNTER — Telehealth: Payer: Self-pay | Admitting: Cardiovascular Disease

## 2013-01-21 MED ORDER — METOPROLOL SUCCINATE ER 50 MG PO TB24: ORAL_TABLET | ORAL | Status: DC

## 2013-01-21 NOTE — Telephone Encounter (Signed)
Decrease Torol to 25 mg daily. Follow up with Dr Tresa EndoKelly in two weeks. If He has syncope he should go to the ER.  Corine ShelterLUKE Sharyah Bostwick PA-C 01/21/2013 10:54 AM

## 2013-01-21 NOTE — Telephone Encounter (Signed)
SPOKE TO SON. INSTRUCTION GIVEN PER LUKE.  SON VERBALIZED UNDERSTANDING . DECREASE  TOPROL 25 MG,KEEP APPT WITH Dr Tresa Endokelly 02/11/13 per Franky MachoLuke.

## 2013-01-21 NOTE — Telephone Encounter (Signed)
Spoke to son. He states his mother informed him that the patient has had 2 episode on 01/10/13 and 01/20/13. The son did not know any details.He wanted to know if the the patient should come in sooner. Informed the son RN needed more information - will have to speak to patient's wife  Called [phone],spoke to Mrs Tildon HuskyBeeson.She states the first time was in the grocery store.Lasting 1-2 MIN The second time moved to quickly per wife only lasting few seconds, no other symptoms.  Informed wife will defer to MD/EXTENDER and call her back.

## 2013-01-21 NOTE — Telephone Encounter (Signed)
Has had two episodes of weakness and dizziness and would like to know if they should bring him in before his appt. Please Call   Thanks

## 2013-02-10 ENCOUNTER — Other Ambulatory Visit: Payer: Self-pay | Admitting: *Deleted

## 2013-02-10 MED ORDER — APIXABAN 2.5 MG PO TABS: 2.5000 mg | ORAL_TABLET | Freq: Two times a day (BID) | ORAL | Status: DC

## 2013-02-11 ENCOUNTER — Encounter: Payer: Self-pay | Admitting: Cardiovascular Disease

## 2013-02-11 ENCOUNTER — Ambulatory Visit (INDEPENDENT_AMBULATORY_CARE_PROVIDER_SITE_OTHER): Payer: Medicare HMO | Admitting: Cardiovascular Disease

## 2013-02-11 VITALS — BP 126/80 | HR 120 | Ht 71.0 in | Wt 158.0 lb

## 2013-02-11 DIAGNOSIS — R55 Syncope and collapse: Secondary | ICD-10-CM

## 2013-02-11 DIAGNOSIS — E785 Hyperlipidemia, unspecified: Secondary | ICD-10-CM

## 2013-02-11 DIAGNOSIS — I495 Sick sinus syndrome: Secondary | ICD-10-CM

## 2013-02-11 DIAGNOSIS — Z7901 Long term (current) use of anticoagulants: Secondary | ICD-10-CM

## 2013-02-11 DIAGNOSIS — I10 Essential (primary) hypertension: Secondary | ICD-10-CM

## 2013-02-11 DIAGNOSIS — I4821 Permanent atrial fibrillation: Secondary | ICD-10-CM

## 2013-02-11 DIAGNOSIS — E119 Type 2 diabetes mellitus without complications: Secondary | ICD-10-CM

## 2013-02-11 DIAGNOSIS — I4891 Unspecified atrial fibrillation: Secondary | ICD-10-CM

## 2013-02-11 LAB — CBC
HEMATOCRIT: 37.9 % — AB (ref 39.0–52.0)
HEMOGLOBIN: 12.1 g/dL — AB (ref 13.0–17.0)
MCH: 26.9 pg (ref 26.0–34.0)
MCHC: 31.9 g/dL (ref 30.0–36.0)
MCV: 84.2 fL (ref 78.0–100.0)
Platelets: 194 10*3/uL (ref 150–400)
RBC: 4.5 MIL/uL (ref 4.22–5.81)
RDW: 13.9 % (ref 11.5–15.5)
WBC: 5.5 10*3/uL (ref 4.0–10.5)

## 2013-02-11 LAB — BASIC METABOLIC PANEL
BUN: 14 mg/dL (ref 6–23)
CHLORIDE: 102 meq/L (ref 96–112)
CO2: 28 mEq/L (ref 19–32)
Calcium: 9.3 mg/dL (ref 8.4–10.5)
Creat: 0.92 mg/dL (ref 0.50–1.35)
GLUCOSE: 145 mg/dL — AB (ref 70–99)
POTASSIUM: 4.5 meq/L (ref 3.5–5.3)
Sodium: 141 mEq/L (ref 135–145)

## 2013-02-11 LAB — MAGNESIUM: Magnesium: 1.8 mg/dL (ref 1.5–2.5)

## 2013-02-11 LAB — TSH: TSH: 1.968 u[IU]/mL (ref 0.350–4.500)

## 2013-02-11 MED ORDER — METOPROLOL SUCCINATE ER 25 MG PO TB24: ORAL_TABLET | ORAL | Status: DC

## 2013-02-11 NOTE — Patient Instructions (Signed)
Your physician has recommended you make the following change in your medication: increase the toprol to 25 mg twice daily.  Your physician recommends that you return for lab work today.  Your physician recommends that you schedule a follow-up appointment in: 3 weeks.

## 2013-02-13 ENCOUNTER — Telehealth: Payer: Self-pay | Admitting: *Deleted

## 2013-02-13 NOTE — Telephone Encounter (Signed)
Pt was calling in regards to her husbands a-fib.   TK

## 2013-02-13 NOTE — Telephone Encounter (Signed)
Returned call and pt verified x 2 w/ pt's wife, Aurea GraffJoan.  Stated she's trying to learn something about this Afib.  Stated she wants to know as much as possible b/c they are in their 580s and it's just the two of them.  RN explained what Afib is and why pt on medication regimen.  Also informed RN will gather some reading materials and mail them that will go into more detail.  Wife verbalized understanding and thankful for RN calling her back.   Materials gathered and mailed.

## 2013-02-15 ENCOUNTER — Encounter: Payer: Self-pay | Admitting: Cardiovascular Disease

## 2013-02-15 NOTE — Progress Notes (Signed)
Patient ID: Gerald [name], male   DOB: August 15, 1926, 80 y.o.   MRN: [id number]       HPI: [name] is an 80 year old gentleman who I saw initially on 09/19/2012  through the courtesy of Dr. Benedetto GoadFred [name] for evaluation of new onset atrial fibrillation.    Mr Gerald [name] has a history of type 2 diabetes mellitus, hyperlipidemia, hypertension, and at times has noticed some mild dizziness. On 08/20/2012 he was evaluated by Dr. Benedetto GoadFred [name] with an irregular heart rhythm and was found to be in atrial fibrillation. When I initially saw him he denied any chest pain, presyncope or syncope. He denies wheezing. He had been taking metoprolol succinate 25 mg in addition to Prinzide 20/25 aspirin 81 mg Lipitor 20 mg in addition to his Amaryl and metformin for diabetes. At that time, I recommended further titration of his metoprolol succinate 50 mg daily and started on Eliquis 2.5 mg twice a day. A 2-D echo Doppler study demonstrated normal systolic function although he did not moderate concentric LVH. Ejection fraction was 55-60%. There was mild aortic valve sclerosis without stenosis, mild AR and mild biatrial enlargement.  [name] inadvertently never did titrate his Toprol to 50 mg daily.  He had noticed some presyncopal-like spells after he had taken his Prinzide. Subsequently, he did titrate his Toprol to 50 mg daily and also started him on Rhythmol150 mg every 8 hours and ecommended he discontinue his Prinzide.  He underwent DC cardioversion on 10/31/2012 and was successfully cardioverted on the third attempt and required 200 J of energy for restoration of sinus rhythm. He subsequently saw Gerald ShelterLuke Kilroy, PA-C in December and apparently was back in atrial fibrillation. Apparently his Rythmol was stopped after he had developed several pauses of less than 3 seconds. He presents now for evaluation.  [name] is unaware of his heart rate beating fast. He denies chest pain. He denies shortness of breath. He denies  bleeding and has been tolerating eliquis. He has hyperlipidemia and has tolerated Lipitor. He also is diabetic on amaryl as well as metformin.  Past Surgical History  Procedure Laterality Date  . No past surgeries    . Cardioversion N/A 10/31/2012    Procedure: CARDIOVERSION;  Surgeon: Gerald Biharihomas A Mirayah Wren, MD;  Location: [facility];  Service: Cardiovascular;  Laterality: N/A;    No Known Allergies  Current Outpatient Prescriptions  Medication Sig Dispense Refill  . acetaminophen (TYLENOL) 325 MG tablet Take 2 tablets (650 mg total) by mouth every 4 (four) hours as needed for headache or mild pain.      Marland Kitchen. apixaban (ELIQUIS) 2.5 MG TABS tablet Take 1 tablet (2.5 mg total) by mouth 2 (two) times daily.  180 tablet  2  . atorvastatin (LIPITOR) 20 MG tablet Take 20 mg by mouth daily.      . folic acid (FOLVITE) 1 MG tablet Take 1 mg by mouth daily.      Marland Kitchen. glimepiride (AMARYL) 1 MG tablet Take 1 mg by mouth daily before breakfast.      . metFORMIN (GLUCOPHAGE) 1000 MG tablet Take 1,000 mg by mouth 2 (two) times daily with a meal.      . Multiple Vitamins-Minerals (PRESERVISION AREDS PO) Take 1 tablet by mouth 2 (two) times daily.      . tamsulosin (FLOMAX) 0.4 MG CAPS capsule Take by mouth.      . metoprolol succinate (TOPROL XL) 25 MG 24 hr tablet Take 1 tablet twice daily.  60 tablet  6  No current facility-administered medications for this visit.    Social history is notable in that he is married. He has one child 3 grandchildren and one great-grandchild. 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 He was in the [employer] from 1951 through 1955. He previously had smoked a pipe but quit 30 years ago. There is no alcohol use.  Family History  Problem Relation Age of Onset  . Heart disease Mother   . Heart failure Mother     ROS is negative for fever chills or night sweats. He denies visual changes. He does have mild decreased hearing. He  denies rash. He denies tremors. He denies any known thyroid problems. He denies wheezing, cough, increased sputum production. There is no recent presyncope or syncope. He denies chest pressure. He denies abdominal pain, nausea vomiting or diarrhea. He denies change in bowel or bladder habits. He denies bleeding. There is no claudication. He denies significant edema. He denies issues with sleep. Other comprehensive 14 point system review is negative.  PE BP 126/80  Pulse 120  Ht 5\' 11"  (1.803 m)  Wt 158 lb (71.668 kg)  BMI 22.05 kg/m2 General: Alert, oriented, no distress.  Skin: normal turgor, no rashes HEENT: Normocephalic, atraumatic. Pupils round and reactive; sclera anicteric; Fundi mild arteriolar narrowing. No hemorrhages or exudates. No xanthelasmas Nose without nasal septal hypertrophy Mouth/Parynx benign; full dentures;  Mallinpatti scale 3 Neck: No JVD, no carotid bruits with normal carotid upstroke Lungs: clear to ausculatation and percussion; no wheezing or rales Chest: Nontender to palpation Heart: Irregularly irregular rhythm with an rapid ventricular rate ranging from 100-120 per minute, 1/6 systolic murmur; no rub. Abdomen: soft, nontender; no hepatosplenomehaly, BS+; abdominal aorta nontender and not dilated by palpation. Back: No CVA tenderness Pulses 2+ Extremities: no clubbinbg cyanosis or edema, Homan's sign negative  Neurologic: grossly nonfocal; cranial nerves grossly normal Psychological: Normal affect and mood    ECG (independently read by me): Atrial fibrillation with a ventricular response of approximately 100-120 beats per minute no significant ST-T changes.   LABS:  BMET    Component Value Date/Time   NA 141 02/11/2013 1128   K 4.5 02/11/2013 1128   CL 102 02/11/2013 1128   CO2 28 02/11/2013 1128   GLUCOSE 145* 02/11/2013 1128   BUN 14 02/11/2013 1128   CREATININE 0.92 02/11/2013 1128   CREATININE 0.93 12/23/2012 1103   CALCIUM 9.3 02/11/2013 1128   GFRNONAA  76* 12/23/2012 1103   GFRAA 88* 12/23/2012 1103     Hepatic Function Panel     Component Value Date/Time   PROT 6.5 10/27/2012 0841   ALBUMIN 4.0 10/27/2012 0841   AST 17 10/27/2012 0841   ALT 10 10/27/2012 0841   ALKPHOS 82 10/27/2012 0841   BILITOT 0.7 10/27/2012 0841   BILIDIR <0.1 03/10/2007 2045   IBILI NOT CALCULATED 03/10/2007 2045     CBC    Component Value Date/Time   WBC 5.5 02/11/2013 1128   RBC 4.50 02/11/2013 1128   HGB 12.1* 02/11/2013 1128   HCT 37.9* 02/11/2013 1128   PLT 194 02/11/2013 1128   MCV 84.2 02/11/2013 1128   MCH 26.9 02/11/2013 1128   MCHC 31.9 02/11/2013 1128   RDW 13.9 02/11/2013 1128   LYMPHSABS 1.2 03/10/2007 1145   MONOABS 0.4 03/10/2007 1145   EOSABS 0.1 03/10/2007 1145   BASOSABS 0.0 03/10/2007 1145     BNP No results found for this basename:  probnp    Lipid Panel     Component Value Date/Time   CHOL 131 09/30/2012 0839   TRIG 84 09/30/2012 0839   HDL 44 09/30/2012 0839   CHOLHDL 3.0 09/30/2012 0839   VLDL 17 09/30/2012 0839   LDLCALC 70 09/30/2012 0839     RADIOLOGY: No results found.   ASSESSMENT AND PLAN:  Mr. Gerald Miller is an 80 year old white gentleman with a history of diabetes mellitus, hypertension, and hyperlipidemia. He had documented atrial fibrillation for at least several months when I had initially seen him and he has been on anticoagulation since August 2014. He does have biatrial enlargement on his previous echo Doppler study. He had undergone cardioversion with successful restoration of sinus rhythm but unfortunately developed recurrent AF. Apparently his Rhythmol  had been stopped due to several pauses of less than 3 seconds. Mr. Gerald Miller is relatively asymptomatic with reference to his atrial fibrillation. At this point, I discussed situation with both he and his son and we had made a decision  to continue with rate control rather than attempt repeat cardioversion or ablation. I have checked laboratory today after his office visit and these are  noted above and have been reviewed. He has only been on 25 mg of Toprol daily. I recommended he increase this to 50 mg per day and initially he will take 25 mg in the morning and at night. I will see him in 3 weeks for followup evaluation and further recommendations will be made at that time.  Gerald Bihari, MD, [facility] 02/15/2013 5:27 PM

## 2013-03-04 ENCOUNTER — Telehealth: Payer: Self-pay | Admitting: *Deleted

## 2013-03-04 NOTE — Telephone Encounter (Signed)
Left message to call back today before 3. Called to give lab results.

## 2013-03-04 NOTE — Telephone Encounter (Signed)
Message copied by Gaynelle CageWADDELL, Tymeshia Awan M. on Wed Mar 04, 2013  1:37 PM ------      Message from: Nicki GuadalajaraKELLY, THOMAS A      Created: Wed Feb 25, 2013 10:31 AM       Glu inc; anemia better ------

## 2013-03-06 ENCOUNTER — Ambulatory Visit (INDEPENDENT_AMBULATORY_CARE_PROVIDER_SITE_OTHER): Payer: Medicare HMO | Admitting: Cardiovascular Disease

## 2013-03-06 ENCOUNTER — Encounter: Payer: Self-pay | Admitting: Cardiovascular Disease

## 2013-03-06 VITALS — BP 140/82 | HR 115 | Ht 71.0 in | Wt 156.1 lb

## 2013-03-06 DIAGNOSIS — E785 Hyperlipidemia, unspecified: Secondary | ICD-10-CM

## 2013-03-06 DIAGNOSIS — E119 Type 2 diabetes mellitus without complications: Secondary | ICD-10-CM

## 2013-03-06 DIAGNOSIS — I4891 Unspecified atrial fibrillation: Secondary | ICD-10-CM

## 2013-03-06 DIAGNOSIS — I10 Essential (primary) hypertension: Secondary | ICD-10-CM

## 2013-03-06 DIAGNOSIS — I4821 Permanent atrial fibrillation: Secondary | ICD-10-CM

## 2013-03-06 DIAGNOSIS — R002 Palpitations: Secondary | ICD-10-CM

## 2013-03-06 DIAGNOSIS — Z7901 Long term (current) use of anticoagulants: Secondary | ICD-10-CM

## 2013-03-06 MED ORDER — METOPROLOL SUCCINATE ER 50 MG PO TB24: 50.0000 mg | ORAL_TABLET | Freq: Every day | ORAL | Status: DC

## 2013-03-06 NOTE — Progress Notes (Signed)
Patient ID: Gerald [name], male   DOB: Oct 18, 1926, 80 y.o.   MRN: [id number]        HPI: Gerald [name] is an 80 year old gentleman who presents to the office today in followup of his atrial fibrillation.  Gerald [name] has a history of type 2 diabetes mellitus, hyperlipidemia, hypertension, and at times has noticed some mild dizziness. On 08/20/2012 he was evaluated by Dr. Benedetto Goad with an irregular heart rhythm and was found to be in atrial fibrillation. When I initially saw him he denied any chest pain, presyncope or syncope. He denies wheezing. He had been taking metoprolol succinate 25 mg in addition to Prinzide 20/25 aspirin 81 mg Lipitor 20 mg in addition to his Amaryl and metformin for diabetes.  At that time, I recommended further titration of his metoprolol succinate 50 mg daily and started on Eliquis 2.5 mg twice a day. A 2-D echo Doppler study demonstrated normal systolic function although he did not moderate concentric LVH. Ejection fraction was 55-60%. There was mild aortic valve sclerosis without stenosis, mild AR and mild biatrial enlargement.  Gerald [name] inadvertently never did titrate his Toprol to 50 mg daily.  He had noticed some presyncopal-like spells after he had taken his Prinzide. Subsequently, he did titrate his Toprol to 50 mg daily and also started him on Rhythmol150 mg every 8 hours and ecommended he discontinue his Prinzide.  He underwent DC cardioversion on 10/31/2012 and was successfully cardioverted on the third attempt and required 200 J of energy for restoration of sinus rhythm. He subsequently saw Corine Shelter, PA-C in December and apparently was back in atrial fibrillation. Apparently his Rythmol was stopped after he had developed several pauses of less than 3 seconds.   When I last saw him, he was remaining fairly asymptomatic despite being in atrial fibrillation. After long discussion with both he and his son it to pursue a rate control therapy rather than an  attempt at repeat cardioversion or 8 ablation. At that time, he had only been on Toprol 25 mg daily and I further titrated to 25 mg twice a day or 50 mg in the morning. On this therapy, he continues to feel well. He states he is now walking up to 30 minutes. He is unaware of shortness of breath. He denies chest pressure. He denies bleeding and has been tolerating eliquis. He has hyperlipidemia and has tolerated Lipitor. He also is diabetic on amaryl as well as metformin.  Past Surgical History  Procedure Laterality Date  . No past surgeries    . Cardioversion N/A 10/31/2012    Procedure: CARDIOVERSION;  Surgeon: Lennette Bihari, MD;  Location: [facility] ENDOSCOPY;  Service: Cardiovascular;  Laterality: N/A;    No Known Allergies  Current Outpatient Prescriptions  Medication Sig Dispense Refill  . acetaminophen (TYLENOL) 325 MG tablet Take 2 tablets (650 mg total) by mouth every 4 (four) hours as needed for headache or mild pain.      Marland Kitchen apixaban (ELIQUIS) 2.5 MG TABS tablet Take 1 tablet (2.5 mg total) by mouth 2 (two) times daily.  180 tablet  2  . atorvastatin (LIPITOR) 20 MG tablet Take 20 mg by mouth daily.      . folic acid (FOLVITE) 1 MG tablet Take 1 mg by mouth daily.      Marland Kitchen glimepiride (AMARYL) 1 MG tablet Take 1 mg by mouth daily before breakfast.      . metFORMIN (GLUCOPHAGE) 1000 MG tablet Take 1,000 mg by mouth 2 (  two) times daily with a meal.      . metoprolol succinate (TOPROL XL) 25 MG 24 hr tablet Take 1 tablet twice daily.  60 tablet  6  . Multiple Vitamins-Minerals (PRESERVISION AREDS PO) Take 1 tablet by mouth 2 (two) times daily.      . tamsulosin (FLOMAX) 0.4 MG CAPS capsule Take by mouth.       No current facility-administered medications for this visit.    Social history is notable in that he is married. He has one child 3 grandchildren and one great-grandchild. 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 He was in the  [employer] from 1951 through 1955. He previously had smoked a pipe but quit 30 years ago. There is no alcohol use.  Family History  Problem Relation Age of Onset  . Heart disease Mother   . Heart failure Mother     ROS is negative for fever chills or night sweats. He denies visual changes. He does have mild decreased hearing. He denies rash. He denies tremors. He denies any known thyroid problems. He denies wheezing, cough, increased sputum production. There is no recent presyncope or syncope. He denies chest pressure. He denies abdominal pain, nausea vomiting or diarrhea. He denies change in bowel or bladder habits. He denies bleeding. There is no claudication. He denies significant edema. He denies tremor. He denies paresthesias He denies issues with sleep. Other comprehensive 14 point system review is negative.  PE BP 140/82  Pulse 115  Ht 5\' 11"  (1.803 m)  Wt 156 lb 1.6 oz (70.806 kg)  BMI 21.78 kg/m2 General: Alert, oriented, no distress.  Skin: normal turgor, no rashes HEENT: Normocephalic, atraumatic. Pupils round and reactive; sclera anicteric; Fundi mild arteriolar narrowing. No hemorrhages or exudates. No xanthelasmas Nose without nasal septal hypertrophy Mouth/Parynx benign; full dentures;  Mallinpatti scale 3 Neck: No JVD, no carotid bruits with normal carotid upstroke Lungs: clear to ausculatation and percussion; no wheezing or rales Chest: Nontender to palpation Heart: Irregularly irregular rhythm with an rapid ventricular rate with an average rate of 115 beats per minute, 1/6 systolic murmur; no rub. No diastolic murmur. No rubs thrills or heaves. Abdomen: soft, nontender; no hepatosplenomehaly, BS+; abdominal aorta nontender and not dilated by palpation. Back: No CVA tenderness Pulses 2+ Extremities: no clubbinbg cyanosis or edema, Homan's sign negative  Neurologic: grossly nonfocal; cranial nerves grossly normal Psychological: Normal affect and mood    ECG (independently  read by me): Atrial fibrillation with a ventricular response of approximately 115 beats per minute,  Mild ST-T change  with T wave inversion in III and F    LABS:  BMET    Component Value Date/Time   NA 141 02/11/2013 1128   K 4.5 02/11/2013 1128   CL 102 02/11/2013 1128   CO2 28 02/11/2013 1128   GLUCOSE 145* 02/11/2013 1128   BUN 14 02/11/2013 1128   CREATININE 0.92 02/11/2013 1128   CREATININE 0.93 12/23/2012 1103   CALCIUM 9.3 02/11/2013 1128   GFRNONAA 76* 12/23/2012 1103   GFRAA 88* 12/23/2012 1103     Hepatic Function Panel     Component Value Date/Time   PROT 6.5 10/27/2012 0841   ALBUMIN 4.0 10/27/2012 0841   AST 17 10/27/2012 0841   ALT 10 10/27/2012 0841   ALKPHOS 82 10/27/2012 0841   BILITOT 0.7 10/27/2012 0841   BILIDIR <0.1 03/10/2007 2045   IBILI NOT CALCULATED 03/10/2007 2045  CBC    Component Value Date/Time   WBC 5.5 02/11/2013 1128   RBC 4.50 02/11/2013 1128   HGB 12.1* 02/11/2013 1128   HCT 37.9* 02/11/2013 1128   PLT 194 02/11/2013 1128   MCV 84.2 02/11/2013 1128   MCH 26.9 02/11/2013 1128   MCHC 31.9 02/11/2013 1128   RDW 13.9 02/11/2013 1128   LYMPHSABS 1.2 03/10/2007 1145   MONOABS 0.4 03/10/2007 1145   EOSABS 0.1 03/10/2007 1145   BASOSABS 0.0 03/10/2007 1145     BNP No results found for this basename: probnp    Lipid Panel     Component Value Date/Time   CHOL 131 09/30/2012 0839   TRIG 84 09/30/2012 0839   HDL 44 09/30/2012 0839   CHOLHDL 3.0 09/30/2012 0839   VLDL 17 09/30/2012 0839   LDLCALC 70 09/30/2012 0839     RADIOLOGY: No results found.   ASSESSMENT AND PLAN:  Gerald [name] is an 80 year old white gentleman with a history of diabetes mellitus, hypertension, and hyperlipidemia. He had documented atrial fibrillation for at least several months when I had initially seen him and he has been on anticoagulation since August 2014. He does have biatrial enlargement on his previous echo Doppler study. He had undergone cardioversion with successful restoration of  sinus rhythm but unfortunately developed recurrent AF. His Rhythmol  had been stopped due to several pauses of less than 3 seconds. At his last office visit, we made the decision to continue a rate control strategy. His ventricular rate is slightly better than his last office visit but continues to be elevated averaging 115 beats per minute. I will now further titrate his Toprol-XL to 50 mg in the morning and 25 mg in the evening for one week and as long as he tolerates this well he will then increase this to 50 mg twice a day. I will see him back in the office in 6-8 weeks for followup evaluation or sooner if problems arise.  Lennette Biharihomas A. Deloyd Handy, MD, [facility] 03/06/2013 12:01 PM

## 2013-03-06 NOTE — Patient Instructions (Signed)
Your physician has recommended you make the following change in your medication: change the metoprolol succ to 50 mg ( 2 tabs in the AM) and 1 tab in the PM for 1 week then take 50 mg twice daily.  Your physician recommends that you schedule a follow-up appointment in: 6 weeks

## 2013-04-27 ENCOUNTER — Ambulatory Visit (INDEPENDENT_AMBULATORY_CARE_PROVIDER_SITE_OTHER): Payer: Medicare HMO | Admitting: Cardiovascular Disease

## 2013-04-27 ENCOUNTER — Encounter: Payer: Self-pay | Admitting: Cardiovascular Disease

## 2013-04-27 VITALS — BP 128/60 | HR 79 | Ht 71.0 in | Wt 158.3 lb

## 2013-04-27 DIAGNOSIS — E119 Type 2 diabetes mellitus without complications: Secondary | ICD-10-CM

## 2013-04-27 DIAGNOSIS — I4891 Unspecified atrial fibrillation: Secondary | ICD-10-CM

## 2013-04-27 DIAGNOSIS — I4821 Permanent atrial fibrillation: Secondary | ICD-10-CM

## 2013-04-27 DIAGNOSIS — E785 Hyperlipidemia, unspecified: Secondary | ICD-10-CM

## 2013-04-27 DIAGNOSIS — I10 Essential (primary) hypertension: Secondary | ICD-10-CM

## 2013-04-27 DIAGNOSIS — Z7901 Long term (current) use of anticoagulants: Secondary | ICD-10-CM

## 2013-04-27 DIAGNOSIS — I495 Sick sinus syndrome: Secondary | ICD-10-CM

## 2013-04-27 MED ORDER — METOPROLOL SUCCINATE ER 50 MG PO TB24: 50.0000 mg | ORAL_TABLET | Freq: Two times a day (BID) | ORAL | Status: DC

## 2013-04-27 NOTE — Progress Notes (Signed)
Patient ID: Gerald [name], male   DOB: [birth year], 80 y.o.   MRN: [id number]        HPI: Gerald [name] is an 80 year old gentleman who presents to the office today in followup of his atrial fibrillation.  Gerald [name] has a history of type 2 diabetes mellitus, hyperlipidemia, hypertension, and has noticed some mild intermittent dizziness. On 08/20/2012 he was evaluated by Dr. Benedetto Goad with an irregular heart rhythm and was found to be in atrial fibrillation. When I initially saw him he denied any chest pain, presyncope or syncope. He denies wheezing. He had been taking metoprolol succinate 25 mg in addition to Prinzide 20/25 aspirin 81 mg Lipitor 20 mg in addition to his Amaryl and metformin for diabetes.  At that time, I recommended further titration of his metoprolol succinate 50 mg daily and started on Eliquis 2.5 mg twice a day. A 2-D echo Doppler study demonstrated normal systolic function although he did not moderate concentric LVH. Ejection fraction was 55-60%. There was mild aortic valve sclerosis without stenosis, mild AR and mild biatrial enlargement.  Gerald [name] inadvertently never did titrate his Toprol to 50 mg daily.  He had noticed some presyncopal-like spells after he had taken his Prinzide. Subsequently, he did titrate his Toprol to 50 mg daily and also started him on Rhythmol150 mg every 8 hours and ecommended he discontinue his Prinzide.  He underwent DC cardioversion on 10/31/2012 and was successfully cardioverted on the third attempt and required 200 J of energy for restoration of sinus rhythm. He subsequently saw Corine Shelter, PA-C in December and apparently was back in atrial fibrillation. Apparently his Rythmol was stopped after he had developed several pauses of less than 3 seconds.   When I last saw him, he was remaining fairly asymptomatic despite being in atrial fibrillation. After long discussion with both he and his son it to pursue a rate control therapy rather than an  attempt at repeat cardioversion or 8 ablation. At that time, he had only been on Toprol 25 mg daily and I further titrated to 25 mg twice a day or 50 mg in the morning.  I last saw him, he was still tachycardic with heart rates in the 100 to 1:15 range and I recommended further titration of his Toprol to 50 mg in the morning 25 mg in the evening for one week and then 50 mg twice a day.  He has felt improved on this and only notes rare episodes of dizziness.  He does walk at a fast pace.  He denies shortness of breath.  He denies chest pain. On this therapy, he continues to feel well. He states he is now walking up to 30 minutes. He is unaware of shortness of breath. He denies chest pressure. He denies bleeding and has been tolerating eliquis. He has hyperlipidemia and has tolerated Lipitor. He also is diabetic on amaryl as well as metformin.  Past Surgical History  Procedure Laterality Date  . No past surgeries    . Cardioversion N/A 10/31/2012    Procedure: CARDIOVERSION;  Surgeon: Lennette Bihari, MD;  Location: [facility] ENDOSCOPY;  Service: Cardiovascular;  Laterality: N/A;    No Known Allergies  Current Outpatient Prescriptions  Medication Sig Dispense Refill  . acetaminophen (TYLENOL) 325 MG tablet Take 2 tablets (650 mg total) by mouth every 4 (four) hours as needed for headache or mild pain.      Marland Kitchen apixaban (ELIQUIS) 2.5 MG TABS tablet Take 1 tablet (2.5 mg  total) by mouth 2 (two) times daily.  180 tablet  2  . atorvastatin (LIPITOR) 20 MG tablet Take 20 mg by mouth daily.      . folic acid (FOLVITE) 1 MG tablet Take 1 mg by mouth daily.      Marland Kitchen. glimepiride (AMARYL) 1 MG tablet Take 1 mg by mouth daily before breakfast.      . metoprolol succinate (TOPROL-XL) 50 MG 24 hr tablet Take 50 mg by mouth 2 (two) times daily. Take with or immediately following a meal.      . Multiple Vitamins-Minerals (PRESERVISION AREDS PO) Take 1 tablet by mouth 2 (two) times daily.      . tamsulosin (FLOMAX) 0.4 MG  CAPS capsule Take by mouth.      . metFORMIN (GLUCOPHAGE) 1000 MG tablet Take 1,000 mg by mouth 2 (two) times daily with a meal.       No current facility-administered medications for this visit.    Social history is notable in that he is married. He has one child 3 grandchildren and one great-grandchild. 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 He completed 12th grade of education. He was in the [employer] from 1951 through 1955. He previously had smoked a pipe but quit 30 years ago. There is no alcohol use.  Family History  Problem Relation Age of Onset  . Heart disease Mother   . Heart failure Mother     ROS is negative for fever chills or night sweats. He denies visual changes. He does have mild decreased hearing. He denies rash. He denies tremors. He denies any known thyroid problems. He denies wheezing, cough, increased sputum production. There is no recent presyncope or syncope. He denies chest pressure. He denies abdominal pain, nausea vomiting or diarrhea. He denies change in bowel or bladder habits. He denies bleeding. There is no claudication. He denies significant edema. He denies tremor. He denies paresthesias He denies issues with sleep. Other comprehensive 14 point system review is negative.  PE BP 128/60  Pulse 79  Ht 5\' 11"  (1.803 m)  Wt 158 lb 4.8 oz (71.804 kg)  BMI 22.09 kg/m2 General: Alert, oriented, no distress.  Skin: normal turgor, no rashes HEENT: Normocephalic, atraumatic. Pupils round and reactive; sclera anicteric; Fundi mild arteriolar narrowing. No hemorrhages or exudates. No xanthelasmas Nose without nasal septal hypertrophy Mouth/Parynx benign; full dentures;  Mallinpatti scale 3 Neck: No JVD, no carotid bruits with normal carotid upstroke Lungs: clear to ausculatation and percussion; no wheezing or rales Chest: Nontender to palpation Heart: Irregularly irregular rhythm with a ventricular rate with an average rate of 80, 1/6 systolic  murmur; no rub. No diastolic murmur. No rubs thrills or heaves. Abdomen: soft, nontender; no hepatosplenomehaly, BS+; abdominal aorta nontender and not dilated by palpation. Back: No CVA tenderness Pulses 2+ Extremities: no clubbinbg cyanosis or edema, Homan's sign negative  Neurologic: grossly nonfocal; cranial nerves grossly normal Psychological: Normal affect and mood  ECG (independently read by me):  Atrial fibrillation with a rate of 79 beats per minute.  Isolated PVC.  Prior 03/06/2013 ECG (independently read by me): Atrial fibrillation with a ventricular response of approximately 115 beats per minute,  Mild ST-T change  with T wave inversion in III and F    LABS:  BMET    Component Value Date/Time   NA 141 02/11/2013 1128   K 4.5 02/11/2013 1128   CL 102 02/11/2013 1128   CO2 28 02/11/2013 1128   GLUCOSE  145* 02/11/2013 1128   BUN 14 02/11/2013 1128   CREATININE 0.92 02/11/2013 1128   CREATININE 0.93 12/23/2012 1103   CALCIUM 9.3 02/11/2013 1128   GFRNONAA 76* 12/23/2012 1103   GFRAA 88* 12/23/2012 1103     Hepatic Function Panel     Component Value Date/Time   PROT 6.5 10/27/2012 0841   ALBUMIN 4.0 10/27/2012 0841   AST 17 10/27/2012 0841   ALT 10 10/27/2012 0841   ALKPHOS 82 10/27/2012 0841   BILITOT 0.7 10/27/2012 0841   BILIDIR <0.1 03/10/2007 2045   IBILI NOT CALCULATED 03/10/2007 2045     CBC    Component Value Date/Time   WBC 5.5 02/11/2013 1128   RBC 4.50 02/11/2013 1128   HGB 12.1* 02/11/2013 1128   HCT 37.9* 02/11/2013 1128   PLT 194 02/11/2013 1128   MCV 84.2 02/11/2013 1128   MCH 26.9 02/11/2013 1128   MCHC 31.9 02/11/2013 1128   RDW 13.9 02/11/2013 1128   LYMPHSABS 1.2 03/10/2007 1145   MONOABS 0.4 03/10/2007 1145   EOSABS 0.1 03/10/2007 1145   BASOSABS 0.0 03/10/2007 1145     BNP No results found for this basename: probnp    Lipid Panel     Component Value Date/Time   CHOL 131 09/30/2012 0839   TRIG 84 09/30/2012 0839   HDL 44 09/30/2012 0839   CHOLHDL 3.0 09/30/2012  0839   VLDL 17 09/30/2012 0839   LDLCALC 70 09/30/2012 0839     RADIOLOGY: No results found.   ASSESSMENT AND PLAN:  Gerald [name] is an 80 year old white gentleman with a history of diabetes mellitus, hypertension, and hyperlipidemia.  He has documented atrial fibrillation, which now is permanent.  He is on a low dose anticoagulation.  His ventricular rate has improved and is now in the 80's with further titration of his Toprol-XL to a total of 100 mg daily (he has been taking this in a 50 twice a day regimen).  According to his son, he only knows one speed, and that is fast.  He walks very rapidly.  He denies exercise induced chest pressure.  He is not orthostatic on blood pressure evaluation by me today.  He has a  rare isolated PVC noted on his electrocardiogram.  He continue current treatment as prescribed.  I'll see him in 6 months for cardiology reevaluation.  He continues to tolerate Lipitor.  There are no myalgias.  He is diabetic and tolerates glimeprimide and metformin.  Lennette Biharihomas A. Ilay Capshaw, MD, [facility] 04/27/2013 11:44 AM

## 2013-04-27 NOTE — Patient Instructions (Signed)
Your physician recommends that you schedule a follow-up appointment in: 6 months  

## 2013-05-06 ENCOUNTER — Telehealth: Payer: Self-pay | Admitting: Cardiovascular Disease

## 2013-05-06 MED ORDER — METOPROLOL SUCCINATE ER 50 MG PO TB24: 50.0000 mg | ORAL_TABLET | Freq: Two times a day (BID) | ORAL | Status: DC

## 2013-05-06 NOTE — Telephone Encounter (Signed)
Please call-concerning a mix up in his Metoprolol prescription.

## 2013-05-06 NOTE — Telephone Encounter (Signed)
Spoke with Lynden AngCathy, daughter in [employer]. At last OV toprol was increased to 50mg  BID, but Rx was printed and not e-faxed. Rx was sent to pharmacy electronically on 05/06/13

## 2013-05-19 ENCOUNTER — Telehealth: Payer: Self-pay | Admitting: Cardiovascular Disease

## 2013-05-19 NOTE — Telephone Encounter (Signed)
Spoke with patient's son, Gerald [name]. He reports his father has been weaker since Dr. Tresa EndoKelly increased his toprol to 50mg  BID. He advised RN call patient/wife. RN spoke with Gerald [name], patient's wife. She states he has been feeling weak and feels like he may fall. Spoke with patient. He states he was going across the yard to a building and felt weakness coming on and felt like he could fall. He said he made it about 30 feet to a tree and fell and laid there a while until he could pull on a branch to help him up. He states this has happened several times since 4/20 when his toprol was increased. He denies blacking out/CP/SOB.   RN had wife take patient's BP with Omron cuff 124/87 HR 75  RN asked that they take his BP tonite before bed, tomorrow AM, and once more tomorrow and call office with information so it can be forwarded on to Dr. Tresa EndoKelly to advise.  Will defer to Dr. Pierre BaliKelly/Wanda to advise

## 2013-05-19 NOTE — Telephone Encounter (Signed)
See other triage call from today.

## 2013-05-19 NOTE — Telephone Encounter (Signed)
Wants to talk to nurse.  Has question about his dosage of toprolol  Having weak spells.  Please call

## 2013-05-19 NOTE — Telephone Encounter (Signed)
Please call, he is having problem with his Metoprolol. Pt says it makes him feel weak.Do you think the pt need to be seen?

## 2013-05-20 NOTE — Telephone Encounter (Signed)
Has BP readings per your request  Yesterday 430 BP 124/87 Pulse 75, 800  pm 126/86 Pulse 83    May 13  BP 129/84 Pulse 70/ Blood Sugar 124  This morning  .  11 am 109/65 Pulse 60  Sugar at 76   230 pm  BP 99/65 Pulse of 57  .Marland Kitchen.Please call

## 2013-05-20 NOTE — Telephone Encounter (Signed)
Spoke to wife and patient . Verbalized understanding. Wife concerned patient felt dizzy earlier today after mowing grass. RN INFORMED BOTH THAT PATIENT HAS EPISODES IN THe past. RN advise patient to wait a few moments  before walking from asitting postiion. ,do not mow grass in the heat of the day.RN offered an an appointment with an extednedr if needed, Dr Tresa EndoKelly is schedule out til July/AUG 2015.  Wife and patient states would like to monitor it further before coming into seeing someone. RN informed them to call if problems or go to the ER. BOTH VERBALIZED UNDERSTANDING.

## 2013-05-20 NOTE — Addendum Note (Signed)
Addended byGaynelle Cage: Kamran Coker M. on: 05/20/2013 01:34 PM   Modules accepted: Orders

## 2013-05-20 NOTE — Telephone Encounter (Signed)
As long as BP is stable and HR > 60 continue with same med

## 2013-11-03 ENCOUNTER — Encounter: Payer: Self-pay | Admitting: Cardiovascular Disease

## 2013-11-03 ENCOUNTER — Ambulatory Visit (INDEPENDENT_AMBULATORY_CARE_PROVIDER_SITE_OTHER): Payer: Medicare HMO | Admitting: Cardiovascular Disease

## 2013-11-03 VITALS — BP 110/64 | HR 92 | Ht 71.0 in | Wt 151.8 lb

## 2013-11-03 DIAGNOSIS — E119 Type 2 diabetes mellitus without complications: Secondary | ICD-10-CM

## 2013-11-03 DIAGNOSIS — Z7901 Long term (current) use of anticoagulants: Secondary | ICD-10-CM

## 2013-11-03 DIAGNOSIS — I10 Essential (primary) hypertension: Secondary | ICD-10-CM

## 2013-11-03 DIAGNOSIS — I4821 Permanent atrial fibrillation: Secondary | ICD-10-CM

## 2013-11-03 DIAGNOSIS — E785 Hyperlipidemia, unspecified: Secondary | ICD-10-CM

## 2013-11-03 DIAGNOSIS — I482 Chronic atrial fibrillation: Secondary | ICD-10-CM

## 2013-11-03 MED ORDER — METOPROLOL SUCCINATE ER 50 MG PO TB24: ORAL_TABLET | ORAL | Status: DC

## 2013-11-03 NOTE — Patient Instructions (Signed)
Your physician has recommended you make the following change in your medication: increase the metoprolol to 75 mg in the AM ( 1& 1/2 tablet) and 50 mg in the pm (1 tablet).  Your physician wants you to follow-up in: 6 months or sooner if needed with Dr. Tresa EndoKelly. You will receive a reminder letter in the mail two months in advance. If you don't receive a letter, please call our office to schedule the follow-up appointment.

## 2013-11-03 NOTE — Progress Notes (Signed)
Patient ID: Gerald [name], male   DOB: [birth year], 80 y.o.   MRN: [id number]     HPI: [name] is an 80 year old gentleman who presents to the office today an 8 month followup of his atrial fibrillation.  Mr Gerald [name] has a history of type 2 diabetes mellitus, hyperlipidemia, hypertension and had noticed intermittent dizziness. On 08/20/2012 he was evaluated by Dr. Benedetto GoadFred Wilson with an irregular heart rhythm and was found to be in atrial fibrillation. When I initially saw him he denied any chest pain, presyncope or syncope. He denies wheezing. He had been taking metoprolol succinate 25 mg in addition to Prinzide 20/25 aspirin 81 mg Lipitor 20 mg in addition to his Amaryl and metformin for diabetes.  At that time, I recommended further titration of his metoprolol succinate 50 mg daily and started on Eliquis 2.5 mg twice a day. A 2-D echo Doppler study demonstrated normal systolic function although he did not moderate concentric LVH. Ejection fraction was 55-60%. There was mild aortic valve sclerosis without stenosis, mild AR and mild biatrial enlargement.  [name] inadvertently never did titrate his Toprol to 50 mg daily.  He had noticed some presyncopal-like spells after he had taken his Prinzide. Subsequently, he did titrate his Toprol to 50 mg daily and also started him on Rhythmol150 mg every 8 hours and recommended he discontinue his Prinzide.  He underwent DC cardioversion on 10/31/2012 and was successfully cardioverted on the third attempt and required 200 J of energy for restoration of sinus rhythm. He subsequently saw Corine ShelterLuke Kilroy, PA-C in December and apparently was back in atrial fibrillation. Apparently his Rythmol was stopped after he had developed several pauses of less than 3 seconds.   When I last saw him, he was remaining fairly asymptomatic despite being in atrial fibrillation. After long discussion with both he and his son they elected to pursue a rate control therapy rather than an  attempt at repeat cardioversion or AF ablation. At that time, he had only been on Toprol 25 mg daily and I further titrated to 25 mg twice a day or 50 mg in the morning.    Presently, he denies any dizziness or chest pain. He does note mild shortness of breath if he walks fast.  He states he walks off and on, sometimes up to 2 hours per day.  He denies chest pressure.  He denies PND, orthopnea.   Past Surgical History  Procedure Laterality Date  . No past surgeries    . Cardioversion N/A 10/31/2012    Procedure: CARDIOVERSION;  Surgeon: Lennette Biharihomas A Shelly Shoultz, MD;  [facility];  Service: Cardiovascular;  Laterality: N/A;    No Known Allergies  Current Outpatient Prescriptions  Medication Sig Dispense Refill  . acetaminophen (TYLENOL) 325 MG tablet Take 2 tablets (650 mg total) by mouth every 4 (four) hours as needed for headache or mild pain.      Marland Kitchen. apixaban (ELIQUIS) 2.5 MG TABS tablet Take 1 tablet (2.5 mg total) by mouth 2 (two) times daily.  180 tablet  2  . atorvastatin (LIPITOR) 20 MG tablet Take 20 mg by mouth daily.      . folic acid (FOLVITE) 1 MG tablet Take 1 mg by mouth daily.      Marland Kitchen. glimepiride (AMARYL) 1 MG tablet Take 1 mg by mouth daily before breakfast.      . metFORMIN (GLUCOPHAGE) 1000 MG tablet Take 1,000 mg by mouth 2 (two) times daily with a meal.      .  metoprolol succinate (TOPROL-XL) 50 MG 24 hr tablet Take 1 tablet (50 mg total) by mouth 2 (two) times daily. Take with or immediately following a meal.  180 tablet  3  . Multiple Vitamins-Minerals (PRESERVISION AREDS PO) Take 1 tablet by mouth 2 (two) times daily.      . tamsulosin (FLOMAX) 0.4 MG CAPS capsule Take by mouth.       No current facility-administered medications for this visit.    Social history is notable in that he is married. He has one child 3 grandchildren and one great-grandchild. 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 He completed 12th grade of education. He was in  the [employer] from 1951 through 1955. He previously had smoked a pipe but quit 30 years ago. There is no alcohol use.  Family History  Problem Relation Age of Onset  . Heart disease Mother   . Heart failure Mother    ROS General: Negative; No fevers, chills, or night sweats;  HEENT: Mild decrease in hearing; No changes in vision, sinus congestion, difficulty swallowing Pulmonary: Negative; No cough, wheezing, shortness of breath, hemoptysis Cardiovascular: Negative; No chest pain, presyncope, syncope, palpitations GI: Negative; No nausea, vomiting, diarrhea, or abdominal pain GU: Negative; No dysuria, hematuria, or difficulty voiding Musculoskeletal: Negative; no myalgias, joint pain, or weakness Hematologic/Oncology: Negative; no easy bruising, bleeding Endocrine: Negative; no heat/cold intolerance; no diabetes Neuro: Negative; no changes in balance, headaches Skin: Negative; No rashes or skin lesions Psychiatric: Negative; No behavioral problems, depression Sleep: Negative; No snoring, daytime sleepiness, hypersomnolence, bruxism, restless legs, hypnogognic hallucinations, no cataplexy Other comprehensive 14 point system review is negative.   PE BP 110/64  Pulse 92  Ht 5\' 11"  (1.803 m)  Wt 151 lb 12.8 oz (68.856 kg)  BMI 21.18 kg/m2 General: Alert, oriented, no distress.  Skin: normal turgor, no rashes HEENT: Normocephalic, atraumatic. Pupils round and reactive; sclera anicteric; Fundi mild arteriolar narrowing. No hemorrhages or exudates. No xanthelasmas Nose without nasal septal hypertrophy Mouth/Parynx benign; full dentures;  Mallinpatti scale 3 Neck: No JVD, no carotid bruits with normal carotid upstroke Lungs: clear to ausculatation and percussion; no wheezing or rales Chest: Nontender to palpation Heart: Irregularly irregular rhythm with a ventricular rate with an average rate of 90's, 1/6 systolic murmur; no rub. No diastolic murmur. No rubs thrills or heaves. Abdomen:  soft, nontender; no hepatosplenomehaly, BS+; abdominal aorta nontender and not dilated by palpation. Back: No CVA tenderness Pulses 2+ Extremities: no clubbinbg cyanosis or edema, Homan's sign negative  Neurologic: grossly nonfocal; cranial nerves grossly normal Psychological: Normal affect and mood  ECG (independently read by me): Atrial fibrillation with ventricular rate at 92 bpm.  PVC  Prior ECG (independently read by me):  Atrial fibrillation with a rate of 79 beats per minute.  Isolated PVC.  Prior 03/06/2013 ECG (independently read by me): Atrial fibrillation with a ventricular response of approximately 115 beats per minute,  Mild ST-T change  with T wave inversion in III and F    LABS:  BMET    Component Value Date/Time   NA 141 02/11/2013 1128   K 4.5 02/11/2013 1128   CL 102 02/11/2013 1128   CO2 28 02/11/2013 1128   GLUCOSE 145* 02/11/2013 1128   BUN 14 02/11/2013 1128   CREATININE 0.92 02/11/2013 1128   CREATININE 0.93 12/23/2012 1103   CALCIUM 9.3 02/11/2013 1128   GFRNONAA 76* 12/23/2012 1103   GFRAA 88* 12/23/2012 1103     Hepatic Function  Panel     Component Value Date/Time   PROT 6.5 10/27/2012 0841   ALBUMIN 4.0 10/27/2012 0841   AST 17 10/27/2012 0841   ALT 10 10/27/2012 0841   ALKPHOS 82 10/27/2012 0841   BILITOT 0.7 10/27/2012 0841   BILIDIR <0.1 03/10/2007 2045   IBILI NOT CALCULATED 03/10/2007 2045     CBC    Component Value Date/Time   WBC 5.5 02/11/2013 1128   RBC 4.50 02/11/2013 1128   HGB 12.1* 02/11/2013 1128   HCT 37.9* 02/11/2013 1128   PLT 194 02/11/2013 1128   MCV 84.2 02/11/2013 1128   MCH 26.9 02/11/2013 1128   MCHC 31.9 02/11/2013 1128   RDW 13.9 02/11/2013 1128   LYMPHSABS 1.2 03/10/2007 1145   MONOABS 0.4 03/10/2007 1145   EOSABS 0.1 03/10/2007 1145   BASOSABS 0.0 03/10/2007 1145     BNP No results found for this basename: probnp    Lipid Panel     Component Value Date/Time   CHOL 131 09/30/2012 0839   TRIG 84 09/30/2012 0839   HDL 44 09/30/2012 0839    CHOLHDL 3.0 09/30/2012 0839   VLDL 17 09/30/2012 0839   LDLCALC 70 09/30/2012 0839     RADIOLOGY: No results found.   ASSESSMENT AND PLAN:  Mr. Gerald Miller is an 80 year old white gentleman with a history of diabetes mellitus, hypertension, and hyperlipidemia.  He has documented atrial fibrillation, which now is permanent.  He is on a low dose anticoagulation with the Eliquis.  Presently, his ventricular rate is in the 90s.  I am recommending further titration of his Toprol-XL to 75 mg in the morning and 50 mg at night.  I will give him samples of Eliquis, since he now is in the "doughnut hole "to help him with the increased expense of the NOAC  Therapy.  He continues to ambulate without chest pain but does note some mild shortness of breath.  He is on Lipitor 20 mg for hyperlipidemia.  Laboratory 1 year ago was excellent.  He tells me Dr. Benedetto Goad had just checked blood work on him approximately 1 month ago and I will try to obtain the results.  He also is diabetic on metformin in addition to glimepirade  and is tolerating these medications well.  He is not having any bleeding on eliquis.  As long as he remains stable, I will see him in 6 months for reevaluation or sooner if problems arise.   Lennette Bihari, MD, [facility] 11/03/2013 8:38 AM

## 2013-12-23 ENCOUNTER — Telehealth: Payer: Self-pay | Admitting: Cardiovascular Disease

## 2013-12-23 MED ORDER — APIXABAN 2.5 MG PO TABS: 2.5000 mg | ORAL_TABLET | Freq: Two times a day (BID) | ORAL | Status: DC

## 2013-12-23 NOTE — Telephone Encounter (Signed)
Patient notified samples x 3 weeks available.  Voiced understanding and appreciation.

## 2013-12-23 NOTE — Telephone Encounter (Signed)
Pt would like some samples of Eliquis please. °

## 2014-01-08 ENCOUNTER — Emergency Department (HOSPITAL_COMMUNITY): Payer: PPO

## 2014-01-08 ENCOUNTER — Encounter (HOSPITAL_COMMUNITY): Payer: Self-pay | Admitting: *Deleted

## 2014-01-08 ENCOUNTER — Emergency Department (HOSPITAL_COMMUNITY)
Admission: EM | Admit: 2014-01-08 | Discharge: 2014-01-08 | Disposition: A | Discharge status: Home / Self Care | Payer: PPO | Attending: Emergency Medicine | Admitting: Emergency Medicine

## 2014-01-08 DIAGNOSIS — R319 Hematuria, unspecified: Secondary | ICD-10-CM | POA: Diagnosis not present

## 2014-01-08 DIAGNOSIS — Z7901 Long term (current) use of anticoagulants: Secondary | ICD-10-CM | POA: Diagnosis not present

## 2014-01-08 DIAGNOSIS — I10 Essential (primary) hypertension: Secondary | ICD-10-CM | POA: Insufficient documentation

## 2014-01-08 DIAGNOSIS — Z79899 Other long term (current) drug therapy: Secondary | ICD-10-CM | POA: Insufficient documentation

## 2014-01-08 DIAGNOSIS — H02003 Unspecified entropion of right eye, unspecified eyelid: Secondary | ICD-10-CM | POA: Diagnosis not present

## 2014-01-08 DIAGNOSIS — Z87891 Personal history of nicotine dependence: Secondary | ICD-10-CM | POA: Diagnosis not present

## 2014-01-08 DIAGNOSIS — E785 Hyperlipidemia, unspecified: Secondary | ICD-10-CM | POA: Diagnosis not present

## 2014-01-08 DIAGNOSIS — R404 Transient alteration of awareness: Secondary | ICD-10-CM | POA: Insufficient documentation

## 2014-01-08 DIAGNOSIS — R4182 Altered mental status, unspecified: Secondary | ICD-10-CM | POA: Diagnosis present

## 2014-01-08 LAB — CBC WITH DIFFERENTIAL/PLATELET
Basophils Absolute: 0 10*3/uL (ref 0.0–0.1)
Basophils Relative: 0 % (ref 0–1)
EOS ABS: 0.1 10*3/uL (ref 0.0–0.7)
EOS PCT: 2 % (ref 0–5)
HEMATOCRIT: 37.2 % — AB (ref 39.0–52.0)
Hemoglobin: 11.8 g/dL — ABNORMAL LOW (ref 13.0–17.0)
LYMPHS ABS: 1.1 10*3/uL (ref 0.7–4.0)
LYMPHS PCT: 21 % (ref 12–46)
MCH: 28.2 pg (ref 26.0–34.0)
MCHC: 31.7 g/dL (ref 30.0–36.0)
MCV: 88.8 fL (ref 78.0–100.0)
MONO ABS: 0.3 10*3/uL (ref 0.1–1.0)
Monocytes Relative: 5 % (ref 3–12)
NEUTROS ABS: 3.8 10*3/uL (ref 1.7–7.7)
Neutrophils Relative %: 72 % (ref 43–77)
Platelets: 141 10*3/uL — ABNORMAL LOW (ref 150–400)
RBC: 4.19 MIL/uL — AB (ref 4.22–5.81)
RDW: 14.4 % (ref 11.5–15.5)
WBC: 5.3 10*3/uL (ref 4.0–10.5)

## 2014-01-08 LAB — TROPONIN I: Troponin I: <0.03 ng/mL (ref <0.031)

## 2014-01-08 LAB — COMPREHENSIVE METABOLIC PANEL
ALK PHOS: 90 U/L (ref 39–117)
ALT: 12 U/L (ref 0–53)
ANION GAP: 10 (ref 5–15)
AST: 26 U/L (ref 0–37)
Albumin: 3.5 g/dL (ref 3.5–5.2)
BUN: 14 mg/dL (ref 6–23)
CALCIUM: 8.9 mg/dL (ref 8.4–10.5)
CO2: 26 mmol/L (ref 19–32)
Chloride: 104 mEq/L (ref 96–112)
Creatinine, Ser: 1.03 mg/dL (ref 0.50–1.35)
GFR calc Af Amer: 74 mL/min — ABNORMAL LOW (ref >90)
GFR calc non Af Amer: 64 mL/min — ABNORMAL LOW (ref >90)
GLUCOSE: 166 mg/dL — AB (ref 70–99)
POTASSIUM: 4.1 mmol/L (ref 3.5–5.1)
SODIUM: 140 mmol/L (ref 135–145)
TOTAL PROTEIN: 6.1 g/dL (ref 6.0–8.3)
Total Bilirubin: 0.6 mg/dL (ref 0.3–1.2)

## 2014-01-08 LAB — URINALYSIS, ROUTINE W REFLEX MICROSCOPIC
Bilirubin Urine: NEGATIVE
Glucose, UA: NEGATIVE mg/dL
KETONES UR: NEGATIVE mg/dL
LEUKOCYTES UA: NEGATIVE
Nitrite: NEGATIVE
Protein, ur: NEGATIVE mg/dL
Specific Gravity, Urine: 1.02 (ref 1.005–1.030)
Urobilinogen, UA: 0.2 mg/dL (ref 0.0–1.0)
pH: 6 (ref 5.0–8.0)

## 2014-01-08 LAB — URINE MICROSCOPIC-ADD ON

## 2014-01-08 LAB — ETHANOL: Alcohol, Ethyl (B): <5 mg/dL (ref 0–9)

## 2014-01-08 LAB — PROTIME-INR
INR: 1.17 (ref 0.00–1.49)
PROTHROMBIN TIME: 15 s (ref 11.6–15.2)

## 2014-01-08 LAB — I-STAT CG4 LACTIC ACID, ED
LACTIC ACID, VENOUS: 3.63 mmol/L — AB (ref 0.5–2.2)
Lactic Acid, Venous: 1.24 mmol/L (ref 0.5–2.2)

## 2014-01-08 LAB — CBG MONITORING, ED: GLUCOSE-CAPILLARY: 139 mg/dL — AB (ref 70–99)

## 2014-01-08 LAB — POC OCCULT BLOOD, ED: FECAL OCCULT BLD: NEGATIVE

## 2014-01-08 MED ORDER — SODIUM CHLORIDE 0.9 % IV BOLUS (SEPSIS)
500.0000 mL | Freq: Once | INTRAVENOUS | Status: AC
  Administered 2014-01-08: 500 mL via INTRAVENOUS

## 2014-01-08 NOTE — ED Notes (Addendum)
Per EMS- pt is from home and was reported by family to be "slightly confused over the last few months" pt has been seen to evaluate for dementia. Pt was reported more confused last night. Pt was reported to be walking around the house with multiple flashlights and saying the power was out. Pt the got himself dress and was going to go to a funeral. Pts family had to drive him to the church to show him there was no funeral. Pt states that he was in [place] when EMS arrived. Pt states the he is in [place] now. Pt is alert to self, place and time at this time. No other complaints at this time.

## 2014-01-08 NOTE — Discharge Instructions (Signed)
Altered Mental Status Altered mental status most often refers to an abnormal change in your responsiveness and awareness. It can affect your speech, thought, mobility, memory, attention span, or alertness. It can range from slight confusion to complete unresponsiveness (coma). Altered mental status can be a sign of a serious underlying medical condition. Rapid evaluation and medical treatment is necessary for patients having an altered mental status. CAUSES   Low blood sugar (hypoglycemia) or diabetes.  Severe loss of body fluids (dehydration) or a body salt (electrolyte) imbalance.  A stroke or other neurologic problem, such as dementia or delirium.  A head injury or tumor.  A drug or alcohol overdose.  Exposure to toxins or poisons.  Depression, anxiety, and stress.  A low oxygen level (hypoxia).  An infection.  Blood loss.  Twitching or shaking (seizure).  Heart problems, such as heart attack or heart rhythm problems (arrhythmias).  A body temperature that is too low or too high (hypothermia or hyperthermia). DIAGNOSIS  A diagnosis is based on your history, symptoms, physical and neurologic examinations, and diagnostic tests. Diagnostic tests may include:  Measurement of your blood pressure, pulse, breathing, and oxygen levels (vital signs).  Blood tests.  Urine tests.  X-ray exams.  A computerized magnetic scan (magnetic resonance imaging, MRI).  A computerized X-ray scan (computed tomography, CT scan). TREATMENT  Treatment will depend on the cause. Treatment may include:  Management of an underlying medical or mental health condition.  Critical care or support in the hospital. HOME CARE INSTRUCTIONS   Only take over-the-counter or prescription medicines for pain, discomfort, or fever as directed by your caregiver.  Manage underlying conditions as directed by your caregiver.  Eat a healthy, well-balanced diet to maintain strength.  Join a support group or  prevention program to cope with the condition or trauma that caused the altered mental status. Ask your caregiver to help choose a program that works for you.  Follow up with your caregiver for further examination, therapy, or testing as directed. SEEK MEDICAL CARE IF:   You feel unwell or have chills.  You or your family notice a change in your behavior or your alertness.  You have trouble following your caregiver's treatment plan.  You have questions or concerns. SEEK IMMEDIATE MEDICAL CARE IF:   You have a rapid heartbeat or have chest pain.  You have difficulty breathing.  You have a fever.  You have a headache with a stiff neck.  You cough up blood.  You have blood in your urine or stool.  You have severe agitation or confusion. MAKE SURE YOU:   Understand these instructions.  Will watch your condition.  Will get help right away if you are not doing well or get worse. Document Released: 06/14/2009 Document Revised: 03/19/2011 Document Reviewed: 06/14/2009 [facility] Patient Information 2015 [place], [place]. This information is not intended to replace advice given to you by your health care provider. Make sure you discuss any questions you have with your health care provider.  Hematuria Hematuria is blood in your urine. It can be caused by a bladder infection, kidney infection, prostate infection, kidney stone, or cancer of your urinary tract. Infections can usually be treated with medicine, and a kidney stone usually will pass through your urine. If neither of these is the cause of your hematuria, further workup to find out the reason may be needed. It is very important that you tell your health care provider about any blood you see in your urine, even if the  blood stops without treatment or happens without causing pain. Blood in your urine that happens and then stops and then happens again can be a symptom of a very serious condition. Also, pain is not a symptom in the  initial stages of many urinary cancers. HOME CARE INSTRUCTIONS   Drink lots of fluid, 3-4 quarts a day. If you have been diagnosed with an infection, cranberry juice is especially recommended, in addition to large amounts of water.  Avoid caffeine, tea, and carbonated beverages because they tend to irritate the bladder.  Avoid alcohol because it may irritate the prostate.  Take all medicines as directed by your health care provider.  If you were prescribed an antibiotic medicine, finish it all even if you start to feel better.  If you have been diagnosed with a kidney stone, follow your health care provider's instructions regarding straining your urine to catch the stone.  Empty your bladder often. Avoid holding urine for long periods of time.  After a bowel movement, women should cleanse front to back. Use each tissue only once.  Empty your bladder before and after sexual intercourse if you are a male. SEEK MEDICAL CARE IF:  You develop back pain.  You have a fever.  You have a feeling of sickness in your stomach (nausea) or vomiting.  Your symptoms are not better in 3 days. Return sooner if you are getting worse. SEEK IMMEDIATE MEDICAL CARE IF:   You develop severe vomiting and are unable to keep the medicine down.  You develop severe back or abdominal pain despite taking your medicines.  You begin passing a large amount of blood or clots in your urine.  You feel extremely weak or faint, or you pass out. MAKE SURE YOU:   Understand these instructions.  Will watch your condition.  Will get help right away if you are not doing well or get worse. Document Released: 12/25/2004 Document Revised: 05/11/2013 Document Reviewed: 08/25/2012 [facility] Patient Information 2015 [place], [place]. This information is not intended to replace advice given to you by your health care provider. Make sure you discuss any questions you have with your health care provider.

## 2014-01-08 NOTE — ED Notes (Signed)
Pt to radiology.

## 2014-01-08 NOTE — ED Notes (Signed)
Lactic acid results given to Dr. James 

## 2014-01-08 NOTE — ED Provider Notes (Signed)
CSN: [id number]     Arrival date & time 01/08/14  1191 History   First MD Initiated Contact with Patient 01/08/14 [phone]     Chief Complaint  Patient presents with  . Altered Mental Status     (Consider location/radiation/quality/duration/timing/severity/associated sxs/prior Treatment) HPI  This is an 81 year old male brought in by EMS for altered mental status. There is a level V caveat due to altered mental status. According to EMS and the patient's son, who gives the history. He has several months of steadily declining confusion consistent with dementia. Around 7:30 AM the patient's wife called her son and asked him to come and check on the patient. The son was greeted at the door by his father who was in a full suit and tie holding several flashlights. He seemed to think that he was in [place] at [employer] and kept stating to his son that the [place] looked exactly like their home. He apparently was convinced that they were attending a funeral in the middle of the night, so much so that his wife had him drive out to the church to prove that there was no one present. EMS states that upon arrival to the hospital. The patient was unaware that he was acting: Hospital in [place], [place], however, still felt that they had traveled from [place] here and had "made really good time and ." He has a past medical history of A. fib, on chronic anticoagulation, history of diabetes and hypertension.  Past Medical History  Diagnosis Date  . Hypertension   . Dysrhythmia   . Diabetes mellitus without complication   . Hyperlipidemia   . Near syncope 12/23/2012  . PAF (paroxysmal atrial fibrillation)    Past Surgical History  Procedure Laterality Date  . No past surgeries    . Cardioversion N/A 10/31/2012    Procedure: CARDIOVERSION;  Surgeon: Lennette Bihari, MD;  [facility]: [facility] ENDOSCOPY;  Service: Cardiovascular;  Laterality: N/A;   Family History  Problem Relation Age of  Onset  . Heart disease Mother   . Heart failure Mother    History  Substance Use Topics  . Smoking status: Former Smoker    Quit date: 03/06/1978  . Smokeless tobacco: Never Used     Comment: quit smoking about 35 years ago.  . Alcohol Use: No    Review of Systems  Unable to perform ROS     Allergies  Review of patient's allergies indicates no known allergies.  Home Medications   Prior to Admission medications   Medication Sig Start Date End Date Taking? Authorizing Provider  acetaminophen (TYLENOL) 325 MG tablet Take 2 tablets (650 mg total) by mouth every 4 (four) hours as needed for headache or mild pain. 12/24/12   Abelino Derrick, PA-C  apixaban (ELIQUIS) 2.5 MG TABS tablet Take 1 tablet (2.5 mg total) by mouth 2 (two) times daily. 12/23/13   Lennette Bihari, MD  atorvastatin (LIPITOR) 20 MG tablet Take 20 mg by mouth daily.    Historical Provider, MD  folic acid (FOLVITE) 1 MG tablet Take 1 mg by mouth daily.    Historical Provider, MD  glimepiride (AMARYL) 1 MG tablet Take 1 mg by mouth daily before breakfast.    Historical Provider, MD  metFORMIN (GLUCOPHAGE) 1000 MG tablet Take 1,000 mg by mouth 2 (two) times daily with a meal.    Historical Provider, MD  metoprolol succinate (TOPROL-XL) 50 MG 24 hr tablet Take 1 & 1/2 tablet in  the morning and 1 tablet in the PM 11/03/13   Lennette Bihari, MD  Multiple Vitamins-Minerals (PRESERVISION AREDS PO) Take 1 tablet by mouth 2 (two) times daily.    Historical Provider, MD  tamsulosin (FLOMAX) 0.4 MG CAPS capsule Take by mouth.    Historical Provider, MD   BP 124/78 mmHg  Pulse 90  Temp(Src) 98.2 F (36.8 C) (Oral)  Resp 18  SpO2 99% Physical Exam  Constitutional: He appears well-developed and well-nourished. No distress.  HENT:  Head: Normocephalic and atraumatic.  Eyes: Conjunctivae and EOM are normal. Pupils are equal, round, and reactive to light. No scleral icterus.  Right eyelid entropion  Neck: Normal range of  motion. Neck supple.  Cardiovascular: Normal rate, regular rhythm and normal heart sounds.   Pulmonary/Chest: Effort normal and breath sounds normal. No respiratory distress.  Abdominal: Soft. There is no tenderness.  Musculoskeletal: He exhibits no edema.  Neurological: He is alert.  Patient is alert and oriented to person and place. He knows that it is the new year, but cannot state specifically which one it is.  Skin: Skin is warm and dry. He is not diaphoretic.  Psychiatric: His behavior is normal.  Nursing note and vitals reviewed.   ED Course  Procedures (including critical care time) Labs Review Labs Reviewed  I-STAT CG4 LACTIC ACID, ED - Abnormal; Notable for the following:    Lactic Acid, Venous 3.63 (*)    All other components within normal limits  CBC WITH DIFFERENTIAL  COMPREHENSIVE METABOLIC PANEL  ETHANOL  PROTIME-INR  URINALYSIS, ROUTINE W REFLEX MICROSCOPIC  CBG MONITORING, ED  POC OCCULT BLOOD, ED    Imaging Review No results found.   EKG Interpretation   Date/Time:  Friday January 08 2014 09:23:58 EST Ventricular Rate:  89 PR Interval:    QRS Duration: 92 QT Interval:  370 QTC Calculation: 450 R Axis:   80 Text Interpretation:  Atrial fibrillation Ventricular premature complex  Anteroseptal infarct, old Confirmed by Fayrene Fearing  MD, MARK ([id number]) on 01/08/2014  9:54:17 AM      MDM   Final diagnoses:  None    Patient is here with altered mental status. This appears to be a delirium as it is acute on chronic dementia. He has an elevated lactic acid at 3.63. Hemoglobin is slightly low at 11.8, thrombocytopenia. Glucose elevated. No change or elevation in patient's serum creatinine. His EKG shows atrial fibrillation is unchanged from previous.   Labs show hemoglobinuria, negative troponin, negative fecal occult blood. Hemoglobin is low at 11.8 and his glucose is elevated. Patient received 500 mL of fluid with resolution of his lactic acidosis and a repeat  lactate of 1.24. I have no explanation for the patient's events. The prior night. I did explain to the son that this is possibly a sundowning event. Patient will be discharged to follow up with his primary care physician regarding his mental status changes and his hemoglobinuria. He is at his baseline mental status throughout visit. He appears stable for discharge at this time. I personally reviewed the imaging tests through PACS system. I have reviewed and interpreted Lab values. I reviewed available ER/hospitalization records through the EMR    Arthor Captain, PA-C 01/08/14 1409  Rolland Porter, MD 01/15/14 [phone]

## 2014-01-10 LAB — URINE CULTURE: Colony Count: 6000

## 2014-01-15 ENCOUNTER — Other Ambulatory Visit: Payer: Self-pay | Admitting: *Deleted

## 2014-01-15 ENCOUNTER — Telehealth: Payer: Self-pay | Admitting: Cardiovascular Disease

## 2014-01-15 MED ORDER — APIXABAN 2.5 MG PO TABS: 2.5000 mg | ORAL_TABLET | Freq: Two times a day (BID) | ORAL | Status: DC

## 2014-01-15 MED ORDER — APIXABAN 2.5 MG PO TABS
2.5000 mg | ORAL_TABLET | Freq: Two times a day (BID) | ORAL | Status: DC
Start: 2014-01-15 — End: 2015-05-25

## 2014-01-15 NOTE — Telephone Encounter (Signed)
Spoke with pt, aware refill has been sent to the pharmacy. 

## 2014-01-15 NOTE — Telephone Encounter (Signed)
Pt need a new prescription for Eliquis. Please [phone]. Please let him know when you do this.

## 2014-01-21 ENCOUNTER — Ambulatory Visit (INDEPENDENT_AMBULATORY_CARE_PROVIDER_SITE_OTHER): Payer: PPO | Admitting: Neurology

## 2014-01-21 ENCOUNTER — Telehealth: Payer: Self-pay | Admitting: Cardiovascular Disease

## 2014-01-21 ENCOUNTER — Encounter: Payer: Self-pay | Admitting: Neurology

## 2014-01-21 VITALS — BP 132/82 | HR 74 | Ht 68.0 in | Wt 151.0 lb

## 2014-01-21 DIAGNOSIS — F0390 Unspecified dementia without behavioral disturbance: Secondary | ICD-10-CM | POA: Insufficient documentation

## 2014-01-21 DIAGNOSIS — F0391 Unspecified dementia with behavioral disturbance: Secondary | ICD-10-CM

## 2014-01-21 DIAGNOSIS — R410 Disorientation, unspecified: Secondary | ICD-10-CM | POA: Insufficient documentation

## 2014-01-21 MED ORDER — QUETIAPINE FUMARATE 25 MG PO TABS: 25.0000 mg | ORAL_TABLET | Freq: Every day | ORAL | Status: DC

## 2014-01-21 MED ORDER — MEMANTINE HCL 10 MG PO TABS: 10.0000 mg | ORAL_TABLET | Freq: Two times a day (BID) | ORAL | Status: AC

## 2014-01-21 MED ORDER — METOPROLOL SUCCINATE ER 50 MG PO TB24: ORAL_TABLET | ORAL | Status: DC

## 2014-01-21 NOTE — Progress Notes (Signed)
PATIENT: Gerald [name] DOB: [birth year]  HISTORICAL  Gerald DineRichard L Burggraf is a 81 yo RH with his wife and son at today's clinical visit, he is referred by his primary care physician Dr. 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, over the past few years, he noticed gradual onset memory trouble, getting worse over the past 2 years, to the point of getting lost while driving, evening time confusion, occasionally visual hallucinations,  He has increased confusion January first 2016, was brought to the emergency room, laboratory showed elevated glucose, mild anemia, hemoglobin of 11, CT head showed atrophy, ventriculomegaly,  He ambulate with a changed gait, stoop forward, difficulty clear floor, no bowel and bladder incontinence, able to dress, bathing, feed himself without difficulty  He only has 1 son, lives close by,  REVIEW OF SYSTEMS: Full 14 system review of systems performed and notable only for memory loss, confusion ALLERGIES: No Known Allergies  HOME MEDICATIONS: Current Outpatient Prescriptions on File Prior to Visit  Medication Sig Dispense Refill  . apixaban (ELIQUIS) 2.5 MG TABS tablet Take 1 tablet (2.5 mg total) by mouth 2 (two) times daily. 60 tablet 12  . atorvastatin (LIPITOR) 20 MG tablet Take 20 mg by mouth daily.    . folic acid (FOLVITE) 1 MG tablet Take 1 mg by mouth daily.    Marland Kitchen. glimepiride (AMARYL) 1 MG tablet Take 1 mg by mouth daily before breakfast.    . metFORMIN (GLUCOPHAGE) 1000 MG tablet Take 1,000 mg by mouth 2 (two) times daily with a meal.    . metoprolol succinate (TOPROL-XL) 50 MG  24 hr tablet Take 1 & 1/2 tablet in the morning and 1 tablet in the PM 75 tablet 6  . Multiple Vitamins-Minerals (PRESERVISION AREDS PO) Take 1 tablet by mouth 2 (two) times daily.    . tamsulosin (FLOMAX) 0.4 MG CAPS capsule Take 0.4 mg by mouth daily.      No current facility-administered medications on file prior to visit.    PAST MEDICAL HISTORY: Past Medical History  Diagnosis Date  . Hypertension   . Dysrhythmia   . Diabetes mellitus without complication   . Hyperlipidemia   . Near syncope 12/23/2012  . PAF (paroxysmal atrial fibrillation)   . Confusion     PAST SURGICAL HISTORY: Past Surgical History  Procedure Laterality Date  . No past surgeries    . Cardioversion N/A 10/31/2012    Procedure: CARDIOVERSION;  Surgeon: Lennette Biharihomas A Kelly, MD;  [facility];  Service: Cardiovascular;  Laterality: N/A;    FAMILY HISTORY: Family History  Problem Relation Age of Onset  . Heart disease Mother   . Heart failure Mother     SOCIAL HISTORY:  History   Social History  . Marital Status: Married    Spouse Name: N/A    Number of Children: N/A  . Years of Education: N/A   Occupational History  . Not on file.   Social History Main Topics  . Smoking status: Former Smoker    Quit date: 03/06/1978  . Smokeless tobacco: Never Used     Comment: quit smoking about 35 years ago.  .Marland Kitchen  Alcohol Use: No  . Drug Use: No  . Sexual Activity: Not on file   Other Topics Concern  . Not on file   Social History Narrative   Patient is retired and has one son. Patient is married and lived with his wife.   Education    Caffeine three Diet Dr Reino Kent    Right handed.     PHYSICAL EXAM   Filed Vitals:   01/21/14 0834  BP: 132/82  Pulse: 74  Height:  (1.727 m)  Weight: 151 lb (68.493 kg)    Not recorded      Body mass index is 22.96 kg/(m^2).   Generalized: In no acute distress  Neck: Supple, no carotid bruits   Cardiac: Regular rate rhythm  Pulmonary:  Clear to auscultation bilaterally  Musculoskeletal: No deformity  Neurological examination  Mentation: Alert oriented to time, place, history taking, and causual conversation  Cranial nerve II-XII: Pupils were equal round reactive to light. Extraocular movements were full.  Visual field were full on confrontational test. Bilateral fundi were sharp.  Facial sensation and strength were normal. Hearing was intact to finger rubbing bilaterally. Uvula tongue midline.  Head turning and shoulder shrug and were normal and symmetric.Tongue protrusion into cheek strength was normal.  Motor: Normal tone, bulk and strength, with exception of mild bilateral ankle dorsi flexion weakness,.  Sensory: Intact to fine touch, pinprick, preserved vibratory sensation at ankles  Coordination: Normal finger to nose, heel-to-shin bilaterally there was no truncal ataxia  Gait: Rising up from seated position without assistance, stooped forward, cautious, mildly unsteady gait, could not walk on heels, or tandem walking,  Romberg signs: Negative  Deep tendon reflexes: Brachioradialis 2/2, biceps 2/2, triceps 2/2, patellar 2/2, Achilles trace, plantar responses were flexor bilaterally.   DIAGNOSTIC DATA (LABS, IMAGING, TESTING) - I reviewed patient records, labs, notes, testing and imaging myself where available.  Lab Results  Component Value Date   WBC 5.3 01/08/2014   HGB 11.8* 01/08/2014   HCT 37.2* 01/08/2014   MCV 88.8 01/08/2014   PLT 141* 01/08/2014      Component Value Date/Time   NA 140 01/08/2014 0945   K 4.1 01/08/2014 0945   CL 104 01/08/2014 0945   CO2 26 01/08/2014 0945   GLUCOSE 166* 01/08/2014 0945   BUN 14 01/08/2014 0945   CREATININE 1.03 01/08/2014 0945   CREATININE 0.92 02/11/2013 1128   CALCIUM 8.9 01/08/2014 0945   PROT 6.1 01/08/2014 0945   ALBUMIN 3.5 01/08/2014 0945   AST 26 01/08/2014 0945   ALT 12 01/08/2014 0945   ALKPHOS 90 01/08/2014 0945   BILITOT 0.6 01/08/2014  0945   GFRNONAA 64* 01/08/2014 0945   GFRAA 74* 01/08/2014 0945   Lab Results  Component Value Date   CHOL 131 09/30/2012   HDL 44 09/30/2012   LDLCALC 70 09/30/2012   TRIG 84 09/30/2012   CHOLHDL 3.0 09/30/2012   Lab Results  Component Value Date   HGBA1C * 03/10/2007    8.3 (NOTE)   The ADA recommends the following therapeutic goals for glycemic   control related to Hgb A1C measurement:   Goal of Therapy:   < 7.0% Hgb A1C   Action Suggested:  > 8.0% Hgb A1C   Ref:  Diabetes Care, 22, Suppl. 1, 1999   No results found for: [facility] Lab Results  Component Value Date   TSH 1.968 02/11/2013      ASSESSMENT AND PLAN  DAYLON LAFAVOR is a 81  y.o. male complains of gradual onset memory trouble, strong family history of dementia, Mini-Mental Status Examination is 2 out of 30 today, he also has history of atrial fibrillation, hypertension diabetes, hyperlipidemia, on chronic anticoagulation treatment, mild gait difficulty, mild distal weakness.  1, dementia, most likely Alzheimer's dementia, complete evaluation with MRI of the brain, laboratory evaluations 2. Start Namenda 10 mg twice a day 3. seroquel 25 mg every night for evening time agitations 4. His gait difficulty likely combination of deconditioning, a component of lumbar stenosis, with bilateral ankle dorsi flexion weakness 5. Return to clinic in 2 months  Levert Feinstein, M.D. Ph.D.  [facility] [address], [address] [place], [place] [address] [phone]

## 2014-01-21 NOTE — Telephone Encounter (Signed)
Pt need a new prescription for his Metoprolol #90. [facility] [place].Please call this in today if possible.

## 2014-01-21 NOTE — Telephone Encounter (Signed)
Refill submitted to patient's preferred pharmacy. Informed patient. Pt voiced understanding, no other stated concerns at this time.  

## 2014-01-22 LAB — VITAMIN B12: Vitamin B-12: 376 pg/mL (ref 211–946)

## 2014-01-22 LAB — TSH: TSH: 2.68 u[IU]/mL (ref 0.450–4.500)

## 2014-01-22 LAB — SEDIMENTATION RATE: SED RATE: 2 mm/h (ref 0–30)

## 2014-01-22 LAB — FOLATE: Folate: >19.9 ng/mL (ref >3.0)

## 2014-01-22 LAB — RPR: RPR Ser Ql: NONREACTIVE

## 2014-01-22 LAB — C-REACTIVE PROTEIN: CRP: 0.6 mg/L (ref 0.0–4.9)

## 2014-01-22 NOTE — Progress Notes (Signed)
Quick Note:  Called and spoke to patients son relayed labs normal. ______

## 2014-01-22 NOTE — Progress Notes (Signed)
Quick Note:  Please call patient for normal labs. ______

## 2014-02-03 ENCOUNTER — Telehealth: Payer: Self-pay | Admitting: Cardiovascular Disease

## 2014-02-03 NOTE — Telephone Encounter (Signed)
Pt's son reported patient had taken evening doses of metoprolol and eliquis this morning along with his morning meds.  Discussed w/ Belenda CruiseKristin, she advised skipping metoprolol dose tonight but continuing eliquis as scheduled, and to communicate to caller that patient may experience some extra bruising. This was discussed with pt's son, he voiced understanding.

## 2014-02-03 NOTE — Telephone Encounter (Signed)
Pt took double of  his Eliquis and Metoprolol. Please call asap to advise.

## 2014-02-05 ENCOUNTER — Ambulatory Visit
Admission: RE | Admit: 2014-02-05 | Discharge: 2014-02-05 | Disposition: A | Discharge status: Home / Self Care | Payer: PPO | Source: Ambulatory Visit | Attending: Neurology | Admitting: Neurology

## 2014-02-05 DIAGNOSIS — R410 Disorientation, unspecified: Secondary | ICD-10-CM

## 2014-02-05 DIAGNOSIS — F0391 Unspecified dementia with behavioral disturbance: Secondary | ICD-10-CM

## 2014-02-08 ENCOUNTER — Telehealth: Payer: Self-pay | Admitting: Neurology

## 2014-02-08 NOTE — Telephone Encounter (Signed)
Spoke to both patient and wife - they verbalized understanding of MRI results.

## 2014-02-08 NOTE — Telephone Encounter (Signed)
Michelle: Please call patient MRI of the brain showed age-related chronic changes, no change in treatment plan, keep follow-up appointment    Abnormal MRI brain (without) demonstrating: 1. Moderate-severe parietal, perisylvian and mesial temporal atrophy. Moderate ventriculomegaly on ex vacuo basis.  2. Mild periventricular and subcortical chronic small vessel ischemic disease.  3. Right parietal convexity arachnoid cyst (2.7x2.0cm; series 9 image 20).  4. No acute findings.  5. Compared to CT on 01/08/14, no significant change.

## 2014-03-25 ENCOUNTER — Ambulatory Visit (INDEPENDENT_AMBULATORY_CARE_PROVIDER_SITE_OTHER): Payer: PPO | Admitting: Neurology

## 2014-03-25 ENCOUNTER — Encounter: Payer: Self-pay | Admitting: Neurology

## 2014-03-25 VITALS — Ht 68.0 in | Wt 158.0 lb

## 2014-03-25 DIAGNOSIS — F0391 Unspecified dementia with behavioral disturbance: Secondary | ICD-10-CM | POA: Diagnosis not present

## 2014-03-25 MED ORDER — QUETIAPINE FUMARATE 25 MG PO TABS: ORAL_TABLET | ORAL | Status: AC

## 2014-03-25 NOTE — Progress Notes (Signed)
PATIENT: Gerald [name] DOB: 1926/09/03  HISTORICAL  Gerald [name] is a 81 yo RH with his wife and son at today's clinical visit, he is referred by his primary care physician Dr. 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, over the past few years, he noticed gradual onset memory trouble, getting worse over the past 2 years, to the point of getting lost while driving, evening time confusion, occasionally visual hallucinations,  He has increased confusion  Since January first 2016, was brought to the emergency room, laboratory showed elevated glucose, mild anemia, hemoglobin of 11, CT head showed atrophy, ventriculomegaly,  He ambulate with a changed gait, stoop forward, difficulty clear floor, no bowel and bladder incontinence, able to dress, bathing, feed himself without difficulty  He only has 1 son, lives close by,  UPDATE March 17th 2016: He has stop driving since Feb 2016, he lives with his wife, he gets up during night, getting ready to go, very nervous, he is taking nemenda  bid, serqoeul , he tend sto feelagitation at evening time, tried seroquel 25 mg half tablets without helping, no significant side effect noticed either  We have reviewed MRI brain,  1. Moderate-severe parietal, perisylvian and mesial temporal atrophy. Moderate ventriculomegaly on ex vacuo basis.  2. Mild periventricular and subcortical chronic small vessel ischemic disease.  3. Right parietal convexity arachnoid cyst (2.7x2.0cm; series 9 image 20).  4. No acute findings.  5. Compared to CT on 01/08/14, no significant  change.  REVIEW OF SYSTEMS: Full 14 system review of systems performed and notable only for memory loss, confusion ALLERGIES: Allergies  Allergen Reactions  . Doxazosin Rash  . Zocor [Simvastatin] Rash    HOME MEDICATIONS: Current Outpatient Prescriptions on File Prior to Visit  Medication Sig Dispense Refill  . apixaban (ELIQUIS) 2.5 MG TABS tablet Take 1 tablet (2.5 mg total) by mouth 2 (two) times daily. 60 tablet 12  . atorvastatin (LIPITOR) 20 MG tablet Take 20 mg by mouth daily.    . folic acid (FOLVITE) 1 MG tablet Take 1 mg by mouth daily.    Marland Kitchen glimepiride (AMARYL) 1 MG tablet Take 1 mg by mouth daily before breakfast.    . LORazepam (ATIVAN) 0.5 MG tablet 0.5 mg daily.  2  . memantine (NAMENDA) 10 MG tablet Take 1 tablet (10 mg total) by mouth 2 (two) times daily. 60 tablet 11  . metFORMIN (GLUCOPHAGE) 1000 MG tablet Take 1,000 mg by mouth 2 (two) times daily with a meal.    . metoprolol succinate (TOPROL-XL) 50 MG 24 hr tablet Take 1 & 1/2 tablet in the morning and 1 tablet in the PM 75 tablet 6  . Multiple Vitamins-Minerals (PRESERVISION AREDS PO) Take 1 tablet by mouth 2 (two) times daily.    . QUEtiapine (SEROQUEL) 25 MG tablet Take 1 tablet (25 mg total) by mouth at bedtime. 30 tablet 11  . tamsulosin (FLOMAX) 0.4 MG CAPS capsule Take 0.4 mg by mouth daily.      No current facility-administered medications on file prior to visit.    PAST MEDICAL HISTORY: Past Medical History  Diagnosis Date  . Hypertension   .  Dysrhythmia   . Diabetes mellitus without complication   . Hyperlipidemia   . Near syncope 12/23/2012  . PAF (paroxysmal atrial fibrillation)   . Confusion     PAST SURGICAL HISTORY: Past Surgical History  Procedure Laterality Date  . No past surgeries    . Cardioversion N/A 10/31/2012    Procedure: CARDIOVERSION;  Surgeon: Lennette Bihari, MD;  Location: [facility] ENDOSCOPY;  Service: Cardiovascular;  Laterality: N/A;    FAMILY HISTORY: Family History   Problem Relation Age of Onset  . Heart disease Mother   . Heart failure Mother     SOCIAL HISTORY:  History   Social History  . Marital Status: Married    Spouse Name: N/A  . Number of Children: N/A  . Years of Education: N/A   Occupational History  . Not on file.   Social History Main Topics  . Smoking status: Former Smoker    Quit date: 03/06/1978  . Smokeless tobacco: Never Used     Comment: quit smoking about 35 years ago.  . Alcohol Use: No  . Drug Use: No  . Sexual Activity: Not on file   Other Topics Concern  . Not on file   Social History Narrative   Patient is retired and has one son. Patient is married and lived with his wife.   Education    Caffeine three Diet Dr Reino Kent    Right handed.     PHYSICAL EXAM   Filed Vitals:   03/25/14 0955  Height:  (1.727 m)  Weight: 158 lb (71.668 kg)    Not recorded      Body mass index is 24.03 kg/(m^2). PHYSICAL EXAMNIATION:  Gen: NAD, conversant, well nourised, obese, well groomed                     Cardiovascular: Regular rate rhythm, no peripheral edema, warm, nontender. Eyes: Conjunctivae clear without exudates or hemorrhage Neck: Supple, no carotid bruise. Pulmonary: Clear to auscultation bilaterally   NEUROLOGICAL EXAM:  MENTAL STATUS: Speech:    Speech is normal; fluent and spontaneous with normal comprehension.  Cognition:   MMSE 19/30, he is not oriented to date, missed 3 out of 3 recalls, could not spell world backwards, animal naming 19 out of 30  CRANIAL NERVES: CN II: Visual fields are full to confrontation. Fundoscopic exam is normal with sharp discs and no vascular changes. Venous pulsations are present bilaterally. Pupils are 4 mm and briskly reactive to light. Visual acuity is 20/20 bilaterally. CN III, IV, VI: extraocular movement are normal. No ptosis. CN V: Facial sensation is intact to pinprick in all 3 divisions bilaterally. Corneal responses are intact.  CN VII: Face is  symmetric with normal eye closure and smile. CN VIII: Hearing is normal to rubbing fingers CN IX, X: Palate elevates symmetrically. Phonation is normal. CN XI: Head turning and shoulder shrug are intact CN XII: Tongue is midline with normal movements and no atrophy.  MOTOR: There is no pronator drift of out-stretched arms. Muscle bulk and tone are normal. Muscle strength is normal.   Shoulder abduction Shoulder external rotation Elbow flexion Elbow extension Wrist flexion Wrist extension Finger abduction Hip flexion Knee flexion Knee extension Ankle dorsi flexion Ankle plantar flexion  R L REFLEXES: Reflexes are 2+ and symmetric at the biceps, triceps,  knees, and ankles. Plantar responses are flexor.  SENSORY: Light touch, pinprick, position sense, and vibration sense are intact in fingers and toes.  COORDINATION: Rapid alternating movements and fine finger movements are intact. There is no dysmetria on finger-to-nose and heel-knee-shin. There are no abnormal or extraneous movements.   GAIT/STANCE: Posture is normal. Gait is steady with normal steps, base, arm swing, and turning. Heel and toe walking are normal. Tandem gait is normal.  Romberg is absent.      DIAGNOSTIC DATA (LABS, IMAGING, TESTING) - I reviewed patient records, labs, notes, testing and imaging myself where available.  Lab Results  Component Value Date   WBC 5.3 01/08/2014   HGB 11.8* 01/08/2014   HCT 37.2* 01/08/2014   MCV 88.8 01/08/2014   PLT 141* 01/08/2014      Component Value Date/Time   NA 140 01/08/2014 0945   K 4.1 01/08/2014 0945   CL 104 01/08/2014 0945   CO2 26 01/08/2014 0945   GLUCOSE 166* 01/08/2014 0945   BUN 14 01/08/2014 0945   CREATININE 1.03 01/08/2014 0945   CREATININE 0.92 02/11/2013 1128   CALCIUM 8.9 01/08/2014 0945   PROT 6.1 01/08/2014 0945   ALBUMIN 3.5 01/08/2014 0945   AST 26 01/08/2014 0945   ALT 12 01/08/2014 0945    ALKPHOS 90 01/08/2014 0945   BILITOT 0.6 01/08/2014 0945   GFRNONAA 64* 01/08/2014 0945   GFRAA 74* 01/08/2014 0945   Lab Results  Component Value Date   CHOL 131 09/30/2012   HDL 44 09/30/2012   LDLCALC 70 09/30/2012   TRIG 84 09/30/2012   CHOLHDL 3.0 09/30/2012   Lab Results  Component Value Date   HGBA1C * 03/10/2007    8.3 (NOTE)   The ADA recommends the following therapeutic goals for glycemic   control related to Hgb A1C measurement:   Goal of Therapy:   < 7.0% Hgb A1C   Action Suggested:  > 8.0% Hgb A1C   Ref:  Diabetes Care, 22, Suppl. 1, 1999   Lab Results  Component Value Date   VITAMINB12 376 01/21/2014   Lab Results  Component Value Date   TSH 2.680 01/21/2014      ASSESSMENT AND PLAN  Gerald DineRichard L Kopf is a 81 y.o. male complains of gradual onset memory trouble, strong family history of dementia, Mini-Mental Status Examination is 16 out of 30 today, he also has history of atrial fibrillation, hypertension diabetes, hyperlipidemia, on chronic anticoagulation treatment, mild gait difficulty, mild distal weakness.  1. Continue Namenda 10 mg twice a day 2. Increase seroquel 25 mg 1-2 tablets every evening for agitations  No orders of the defined types were placed in this encounter.    New Prescriptions   No medications on file    Medications Discontinued During This Encounter  Medication Reason  . QUEtiapine (SEROQUEL) 25 MG tablet Reorder    Return in about 3 months (around 06/25/2014). Levert FeinsteinYijun Kaydan Wong, M.D. Ph.D.  [facility] [address], [address] [place], [place] [address] [phone]

## 2014-04-29 ENCOUNTER — Encounter: Payer: Self-pay | Admitting: Cardiovascular Disease

## 2014-04-29 ENCOUNTER — Ambulatory Visit (INDEPENDENT_AMBULATORY_CARE_PROVIDER_SITE_OTHER): Payer: PPO | Admitting: Cardiovascular Disease

## 2014-04-29 VITALS — BP 150/78 | HR 90 | Ht 71.0 in | Wt 159.1 lb

## 2014-04-29 DIAGNOSIS — E785 Hyperlipidemia, unspecified: Secondary | ICD-10-CM | POA: Diagnosis not present

## 2014-04-29 DIAGNOSIS — I482 Chronic atrial fibrillation: Secondary | ICD-10-CM | POA: Diagnosis not present

## 2014-04-29 DIAGNOSIS — I10 Essential (primary) hypertension: Secondary | ICD-10-CM

## 2014-04-29 DIAGNOSIS — Z7901 Long term (current) use of anticoagulants: Secondary | ICD-10-CM | POA: Diagnosis not present

## 2014-04-29 DIAGNOSIS — F0390 Unspecified dementia without behavioral disturbance: Secondary | ICD-10-CM

## 2014-04-29 DIAGNOSIS — I4821 Permanent atrial fibrillation: Secondary | ICD-10-CM

## 2014-04-29 MED ORDER — METOPROLOL SUCCINATE ER 50 MG PO TB24: ORAL_TABLET | ORAL | Status: DC

## 2014-04-29 NOTE — Patient Instructions (Addendum)
Your physician has recommended you make the following change in your medication: increase the metoprolol succ. to 100 mg in the morning and 50 mg in the evening.  Your physician wants you to follow-up in: 6 months or sooner if needed. You will receive a reminder letter in the mail two months in advance. If you don't receive a letter, please call our office to schedule the follow-up appointment.

## 2014-04-29 NOTE — Progress Notes (Signed)
Patient ID: Gerald [name], male   DOB: January 13, 1926, 81 y.o.   MRN: [id number]     HPI: Gerald. Gerald [name] is an 81 year old gentleman who presents to the office today an 6 month followup of his atrial fibrillation.  Gerald [name] has a history of type 2 diabetes mellitus, hyperlipidemia, hypertension and in 08/2012 was found to be in atrial fibrillation. When I initially saw him he denied any chest pain, presyncope or syncope. He denies wheezing. He had been taking metoprolol succinate 25 mg in addition to Prinzide 20/25 aspirin 81 mg Lipitor 20 mg in addition to his Amaryl and metformin for diabetes.  At that time, I recommended further titration of his metoprolol succinate 50 mg daily and started on Eliquis 2.5 mg twice a day. A 2-D echo Doppler study demonstrated normal systolic function although he did not moderate concentric LVH. Ejection fraction was 55-60%. There was mild aortic valve sclerosis without stenosis, mild AR and mild biatrial enlargement.  Gerald. Gerald [name] inadvertently never did titrate his Toprol to 50 mg daily.  He had noticed some presyncopal-like spells after he had taken his Prinzide. Subsequently, he  titrated his Toprol to 50 mg daily and he was started  on Rhythmol150 mg every 8 hours and recommended he discontinue his Prinzide.  He underwent DC cardioversion on 10/31/2012 and was successfully cardioverted on the third attempt and required 200 J of energy for restoration of sinus rhythm. He subsequently saw Kerin Ransom, PA-C in December and apparently was back in atrial fibrillation. Apparently his Rythmol was stopped after he had developed several pauses of less than 3 seconds.   When I last saw him, he was remaining fairly asymptomatic despite being in atrial fibrillation. After long discussion with both he and his son they elected to pursue a rate control therapy rather than an attempt at repeat cardioversion or AF ablation. At that time, he had only been on Toprol 25 mg daily and I  further titrated to 25 mg twice a day or 50 mg in the morning.    Most recently, he has been maintained on Toprol-XL 75 mg in the morning and 50 mg in the evening.  He feels well on this regimen and denies any significant dizziness.  He walks 2 hours per day.  Because of concerns of recent dementia as well as macular degeneration.  He was told both by his neurologist and I Dr. not to drive.  He also has a history of hyperlipidemia and is on atorvastatin 20 mg.  He has a history of diabetes mellitus on Glucophage 1000 mg twice a day in addition to Amaryl 1 mg.  He is on Eliquis anticoagulation and denies bleeding.  He presents for six-month evaluation.  Past Surgical History  Procedure Laterality Date  . No past surgeries    . Cardioversion N/A 10/31/2012    Procedure: CARDIOVERSION;  Surgeon: Troy Sine, MD;  Location: [facility] ENDOSCOPY;  Service: Cardiovascular;  Laterality: N/A;    Allergies  Allergen Reactions  . Doxazosin Rash  . Zocor [Simvastatin] Rash    Current Outpatient Prescriptions  Medication Sig Dispense Refill  . apixaban (ELIQUIS) 2.5 MG TABS tablet Take 1 tablet (2.5 mg total) by mouth 2 (two) times daily. 60 tablet 12  . atorvastatin (LIPITOR) 20 MG tablet Take 20 mg by mouth daily.    . folic acid (FOLVITE) 1 MG tablet Take 1 mg by mouth daily.    Marland Kitchen glimepiride (AMARYL) 1 MG tablet Take 1 mg by mouth  daily before breakfast.    . insulin lispro protamine-lispro (HUMALOG 75/25 MIX) (75-25) 100 UNIT/ML SUSP injection Inject 50 Units into the skin 2 (two) times daily with a meal.    . LORazepam (ATIVAN) 0.5 MG tablet 0.5 mg daily.  2  . memantine (NAMENDA) 10 MG tablet Take 1 tablet (10 mg total) by mouth 2 (two) times daily. 60 tablet 11  . metFORMIN (GLUCOPHAGE) 1000 MG tablet Take 1,000 mg by mouth 2 (two) times daily with a meal.    . metoprolol succinate (TOPROL-XL) 50 MG 24 hr tablet Take 1 & 1/2 tablet in the morning and 1 tablet in the PM 75 tablet 6  . Multiple  Vitamins-Minerals (PRESERVISION AREDS PO) Take 1 tablet by mouth 2 (two) times daily.    . QUEtiapine (SEROQUEL) 25 MG tablet 2 tabs po qhs 60 tablet 6  . tamsulosin (FLOMAX) 0.4 MG CAPS capsule Take 0.4 mg by mouth daily.      No current facility-administered medications for this visit.    Social history is notable in that he is married. He has one child 3 grandchildren and one great-grandchild. He is a retired Theatre stage manager and previously worked at [employer] He completed 12th grade of education. He was in the [employer] from 1951 through 1955. He previously had smoked a pipe but quit 30 years ago. There is no alcohol use.  Family History  Problem Relation Age of Onset  . Heart disease Mother   . Heart failure Mother    ROS General: Negative; No fevers, chills, or night sweats;  HEENT: Mild decrease in hearing; [place] for macular degeneration; No sinus congestion, difficulty swallowing Pulmonary: Negative; No cough, wheezing, shortness of breath, hemoptysis Cardiovascular: Negative; No chest pain, presyncope, syncope, palpitations GI: Negative; No nausea, vomiting, diarrhea, or abdominal pain GU: Negative; No dysuria, hematuria, or difficulty voiding Musculoskeletal: Negative; no myalgias, joint pain, or weakness Hematologic/Oncology: Negative; no easy bruising, bleeding Endocrine: Negative; no heat/cold intolerance; no diabetes Neuro: Positive for mild dementia Skin: Negative; No rashes or skin lesions Psychiatric: Negative; No behavioral problems, depression Sleep: Negative; No snoring, daytime sleepiness, hypersomnolence, bruxism, restless legs, hypnogognic hallucinations, no cataplexy Other comprehensive 14 point system review is negative.   PE BP 150/78 mmHg  Pulse 90  Ht $R'5\' 11"'FX$  (1.803 m)  Wt 159 lb 1.6 oz (72.167 kg)  BMI 22.20 kg/m2 General: Alert, oriented, no distress.  Skin: normal turgor, no rashes HEENT: Normocephalic, atraumatic. Pupils round and reactive; sclera  anicteric; Fundi mild arteriolar narrowing. No hemorrhages or exudates. No xanthelasmas Nose without nasal septal hypertrophy Mouth/Parynx benign; full dentures;  Mallinpatti scale 3 Neck: No JVD, no carotid bruits with normal carotid upstroke Lungs: clear to ausculatation and percussion; no wheezing or rales Chest: Nontender to palpation Heart: Irregularly irregular rhythm with a ventricular rate with an average rate of 03'-474, 1/6 systolic murmur; no rub. No diastolic murmur. No rubs thrills or heaves. Abdomen: soft, nontender; no hepatosplenomehaly, BS+; abdominal aorta nontender and not dilated by palpation. Back: No CVA tenderness Pulses 2+ Extremities: no clubbinbg cyanosis or edema, Homan's sign negative  Neurologic: grossly nonfocal; cranial nerves grossly normal Psychological: Normal affect and mood  ECG (independently read by me): Atrial fibrillation with a rate at 90 bpm.  No significant ST-T change.  October 2015 ECG (independently read by me): Atrial fibrillation with ventricular rate at 92 bpm.  PVC  Prior ECG (independently read by me):  Atrial fibrillation with a rate of 79 beats per minute.  Isolated  PVC.  Prior 03/06/2013 ECG (independently read by me): Atrial fibrillation with a ventricular response of approximately 115 beats per minute,  Mild ST-T change  with T wave inversion in III and F    LABS:  BMET  BMP Latest Ref Rng 01/08/2014 02/11/2013 12/23/2012  Glucose 70 - 99 mg/dL 166(H) 145(H) 71  BUN 6 - 23 mg/dL _0 Creatinine 0.50 - 1.35 mg/dL 1.03 0.92 0.93  Sodium 135 - 145 mmol/L 140 141 139  Potassium 3.5 - 5.1 mmol/L 4.1 4.5 4.9  Chloride 96 - 112 mEq/L 104 102 104  CO2 19 - 32 mmol/L _1 Calcium 8.4 - 10.5 mg/dL 8.9 9.3 9.0     Hepatic Function Panel   Hepatic Function Latest Ref Rng 01/08/2014 10/27/2012 09/30/2012  Total Protein 6.0 - 8.3 g/dL 6.1 6.5 7.4  Albumin 3.5 - 5.2 g/dL 3.5 4.0 4.1  AST 0 - 37 U/L _2 ALT 0 - 53 U/L _3 Alk Phosphatase 39 - 117 U/L 90 82 90  Total Bilirubin 0.3 - 1.2 mg/dL 0.6 0.7 0.7  Bilirubin, Direct - - - -     CBC   CBC Latest Ref Rng 01/08/2014 02/11/2013 12/24/2012  WBC 4.0 - 10.5 K/uL 5.3 5.5 4.0  Hemoglobin 13.0 - 17.0 g/dL 11.8(L) 12.1(L) 10.8(L)  Hematocrit 39.0 - 52.0 % 37.2(L) 37.9(L) 33.8(L)  Platelets 150 - 400 K/uL 141(L) 194 153   Lab Results  Component Value Date   TSH 2.680 01/21/2014   BNP No results found for: PROBNP  Lipid Panel     Component Value Date/Time   CHOL 131 09/30/2012 0839   TRIG 84 09/30/2012 0839   HDL 44 09/30/2012 0839   CHOLHDL 3.0 09/30/2012 0839   VLDL 17 09/30/2012 0839   LDLCALC 70 09/30/2012 0839     RADIOLOGY: No results found.   ASSESSMENT AND PLAN:  Gerald. Gerald [name] is an 81 year old white gentleman with a history of diabetes mellitus, hypertension, and hyperlipidemia.  He has documented atrial fibrillation, which now is permanent.  He is on a low dose anticoagulation with the Eliquis and is doing well without bleeding.  He remains active and walks 2 hours per day.  At rest, his ventricular rate is in the 90s.  I have recommended slight additional titration of his Toprol to 100 mg in the morning and 50 mg in the evening.  He'll continue with his present dose of Eliquis 2.5 mg twice a day particularly with his age of 47 years.  He is diabetic and tolerating his medication.  I reviewed recent laboratory.  I also reviewed a recent MRI of his brain which demonstrated moderate to severe parietal, very sylvian and mesial temporal atrophy.  There was mild periventricular and subcortical chronic small vessel ischemic disease.  There is evidence for arachnoid cyst in the right parietal convexity.  This was not significant weight change from prior CT study.  He will continue current therapy as prescribed.  I will see him in 6 months for cardiology reevaluation and further recommendations will be made at that time.  Time spent: 25  minutes  Troy Sine, MD, [facility] 04/29/2014 9:12 AM

## 2014-05-03 ENCOUNTER — Ambulatory Visit: Payer: PPO | Admitting: Cardiovascular Disease

## 2014-05-07 ENCOUNTER — Emergency Department (HOSPITAL_COMMUNITY)
Admission: EM | Admit: 2014-05-07 | Discharge: 2014-05-08 | Disposition: A | Discharge status: Home / Self Care | Payer: PPO | Attending: Emergency Medicine | Admitting: Emergency Medicine

## 2014-05-07 ENCOUNTER — Emergency Department (HOSPITAL_COMMUNITY): Payer: PPO

## 2014-05-07 ENCOUNTER — Encounter (HOSPITAL_COMMUNITY): Payer: Self-pay | Admitting: Emergency Medicine

## 2014-05-07 DIAGNOSIS — Z7902 Long term (current) use of antithrombotics/antiplatelets: Secondary | ICD-10-CM | POA: Insufficient documentation

## 2014-05-07 DIAGNOSIS — I10 Essential (primary) hypertension: Secondary | ICD-10-CM | POA: Diagnosis not present

## 2014-05-07 DIAGNOSIS — Z794 Long term (current) use of insulin: Secondary | ICD-10-CM | POA: Insufficient documentation

## 2014-05-07 DIAGNOSIS — R4689 Other symptoms and signs involving appearance and behavior: Secondary | ICD-10-CM

## 2014-05-07 DIAGNOSIS — E785 Hyperlipidemia, unspecified: Secondary | ICD-10-CM | POA: Diagnosis not present

## 2014-05-07 DIAGNOSIS — E119 Type 2 diabetes mellitus without complications: Secondary | ICD-10-CM | POA: Diagnosis not present

## 2014-05-07 DIAGNOSIS — Z79899 Other long term (current) drug therapy: Secondary | ICD-10-CM | POA: Insufficient documentation

## 2014-05-07 DIAGNOSIS — R451 Restlessness and agitation: Secondary | ICD-10-CM

## 2014-05-07 LAB — COMPREHENSIVE METABOLIC PANEL
ALK PHOS: 105 U/L (ref 39–117)
ALT: 11 U/L (ref 0–53)
ANION GAP: 7 (ref 5–15)
AST: 18 U/L (ref 0–37)
Albumin: 3.8 g/dL (ref 3.5–5.2)
BILIRUBIN TOTAL: 0.8 mg/dL (ref 0.3–1.2)
BUN: 15 mg/dL (ref 6–23)
CHLORIDE: 107 mmol/L (ref 96–112)
CO2: 28 mmol/L (ref 19–32)
CREATININE: 0.84 mg/dL (ref 0.50–1.35)
Calcium: 8.9 mg/dL (ref 8.4–10.5)
GFR calc Af Amer: 90 mL/min — ABNORMAL LOW (ref >90)
GFR, EST NON AFRICAN AMERICAN: 78 mL/min — AB (ref >90)
Glucose, Bld: 129 mg/dL — ABNORMAL HIGH (ref 70–99)
Potassium: 4 mmol/L (ref 3.5–5.1)
Sodium: 142 mmol/L (ref 135–145)
Total Protein: 6.7 g/dL (ref 6.0–8.3)

## 2014-05-07 LAB — URINALYSIS, ROUTINE W REFLEX MICROSCOPIC
Bilirubin Urine: NEGATIVE
Glucose, UA: 100 mg/dL — AB
KETONES UR: NEGATIVE mg/dL
NITRITE: NEGATIVE
PROTEIN: NEGATIVE mg/dL
Specific Gravity, Urine: 1.022 (ref 1.005–1.030)
Urobilinogen, UA: 1 mg/dL (ref 0.0–1.0)
pH: 5.5 (ref 5.0–8.0)

## 2014-05-07 LAB — CBC
HCT: 38.1 % — ABNORMAL LOW (ref 39.0–52.0)
HEMOGLOBIN: 11.5 g/dL — AB (ref 13.0–17.0)
MCH: 27.1 pg (ref 26.0–34.0)
MCHC: 30.2 g/dL (ref 30.0–36.0)
MCV: 89.6 fL (ref 78.0–100.0)
Platelets: 156 10*3/uL (ref 150–400)
RBC: 4.25 MIL/uL (ref 4.22–5.81)
RDW: 14.5 % (ref 11.5–15.5)
WBC: 5.1 10*3/uL (ref 4.0–10.5)

## 2014-05-07 LAB — URINE MICROSCOPIC-ADD ON

## 2014-05-07 LAB — RAPID URINE DRUG SCREEN, HOSP PERFORMED
AMPHETAMINES: NOT DETECTED
BENZODIAZEPINES: NOT DETECTED
Barbiturates: NOT DETECTED
COCAINE: NOT DETECTED
Opiates: NOT DETECTED
Tetrahydrocannabinol: NOT DETECTED

## 2014-05-07 LAB — SALICYLATE LEVEL

## 2014-05-07 LAB — ETHANOL: Alcohol, Ethyl (B): <5 mg/dL (ref 0–9)

## 2014-05-07 LAB — ACETAMINOPHEN LEVEL

## 2014-05-07 MED ORDER — QUETIAPINE FUMARATE 25 MG PO TABS
25.0000 mg | ORAL_TABLET | Freq: Every day | ORAL | Status: DC
  Administered 2014-05-08: 25 mg via ORAL
  Filled 2014-05-07: qty 1

## 2014-05-07 MED ORDER — ATORVASTATIN CALCIUM 20 MG PO TABS
20.0000 mg | ORAL_TABLET | Freq: Every day | ORAL | Status: DC
  Filled 2014-05-07: qty 1

## 2014-05-07 MED ORDER — GLIMEPIRIDE 1 MG PO TABS
1.0000 mg | ORAL_TABLET | Freq: Every day | ORAL | Status: DC
  Administered 2014-05-08: 1 mg via ORAL
  Filled 2014-05-07 (×2): qty 1

## 2014-05-07 MED ORDER — APIXABAN 2.5 MG PO TABS
2.5000 mg | ORAL_TABLET | Freq: Two times a day (BID) | ORAL | Status: DC
  Filled 2014-05-07 (×3): qty 1

## 2014-05-07 MED ORDER — MEMANTINE HCL 10 MG PO TABS
10.0000 mg | ORAL_TABLET | Freq: Two times a day (BID) | ORAL | Status: DC
  Filled 2014-05-07 (×3): qty 1

## 2014-05-07 MED ORDER — METOPROLOL TARTRATE 25 MG PO TABS
50.0000 mg | ORAL_TABLET | Freq: Once | ORAL | Status: AC
  Administered 2014-05-08: 50 mg via ORAL
  Filled 2014-05-07: qty 2

## 2014-05-07 NOTE — ED Notes (Signed)
Pt son is Alease FrameRichard Guia cell [phone]  Home number is [phone]. Please call at anytime to obtain additional information.

## 2014-05-07 NOTE — ED Notes (Signed)
Bed: WLPT3 Expected date:  Expected time:  Means of arrival:  Comments: Pt getting dressed

## 2014-05-07 NOTE — ED Notes (Signed)
Pt from home with son. 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 Per son pt stated "if I can't drive I might as well shoot myself". Pt denies SI or HI but has dementia. Per son patient also states he wanted to get in his truck and home but patient was already home. Son reports that patient reports that he is scared that they will leave him and he won't have a way to get around".

## 2014-05-08 NOTE — ED Notes (Signed)
Contacted patient's son to transport home. Son reports he hill be here in 45 minutes. Patient made aware of same.

## 2014-05-08 NOTE — Discharge Instructions (Signed)
Suicidal Feelings, How to Help Yourself Gerald [name], see your primary care physician within 3 days for continued management. If any symptoms worsen or if you have suicidal thoughts come back to emergency department immediately. Thank you. Everyone feels sad or unhappy at times, but depressing thoughts and feelings of hopelessness can lead to thoughts of suicide. It can seem as if life is too tough to handle. If you feel as though you have reached the point where suicide is the only answer, it is time to let someone know immediately.  HOW TO COPE AND PREVENT SUICIDE  Let family, friends, teachers, or counselors know. Get help. Try not to isolate yourself from those who care about you. Even though you may not feel sociable, talk with someone every day. It is best if it is face-to-face. Remember, they will want to help you.  Eat a regularly spaced and well-balanced diet.  Get plenty of rest.  Avoid alcohol and drugs because they will only make you feel worse and may also lower your inhibitions. Remove them from the home. If you are thinking of taking an overdose of your prescribed medicines, give your medicines to someone who can give them to you one day at a time. If you are on antidepressants, let your caregiver know of your feelings so he or she can provide a safer medicine, if that is a concern.  Remove weapons or poisons from your home.  Try to stick to routines. Follow a schedule and remind yourself that you have to keep that schedule every day.  Set some realistic goals and achieve them. Make a list and cross things off as you go. Accomplishments give a sense of worth. Wait until you are feeling better before doing things you find difficult or unpleasant to do.  If you are able, try to start exercising. Even half-hour periods of exercise each day will make you feel better. Getting out in the sun or into nature helps you recover from depression faster. If you have a favorite place to walk, take  advantage of that.  Increase safe activities that have always given you pleasure. This may include playing your favorite music, reading a good book, painting a picture, or playing your favorite instrument. Do whatever takes your mind off your depression.  Keep your living space well-lighted. GET HELP Contact a suicide hotline, crisis center, or local suicide prevention center for help right away. Local centers may include a hospital, clinic, community service organization, social service provider, or health department.  Call your local emergency services (911 in the [place]).  Call a suicide hotline:  1-800-273-TALK (405-761-7948) in the [place].  1-800-SUICIDE (984)213-7478) in the [place].  2134120940 in the [place] for Spanish-speaking counselors.  4-696-295-2WUX (231)446-2411) in the [place] for TTY users.  Visit the following websites for information and help:  National Suicide Prevention Lifeline: www.suicidepreventionlifeline.org  Hopeline: www.hopeline.com  [employer] for Suicide Prevention: https://www.ayers.com/  For lesbian, gay, bisexual, transgender, or questioning youth, contact [employer]:  3-664-4-I-HKVQQV 971-420-2017) in the [place].  www.thetrevorproject.org  In [place], treatment resources are listed in each province with listings available under [employer] for [employer] or similar titles. Another source for Crisis Centres by [place] is located at http://www.suicideprevention.ca/in-crisis-now/find-a-crisis-centre-now/crisis-centres Document Released: 07/01/2002 Document Revised: 03/19/2011 Document Reviewed: 04/21/2013 [facility] Patient Information 2015 [place], [place]. This information is not intended to replace advice given to you by your health care provider. Make sure you discuss any questions you have with your  health care provider. Substance Abuse Treatment Programs  Intensive Outpatient  Programs [facility]     [address]      [place], [place]                   [phone]       [facility] [address] [place], [place] [phone]  [facility]     (Inpatient and outpatient)     [address]           [phone]    [facility] [phone] (Suboxone and Methadone)  [address]      [place], [place] [address]      [phone]       [address] [place], [place] [phone]  [facility] (Outpatient/Inpatient, Chemical)    (insurance only) [phone]             [facility] (Groups & Residential) [place], [place] [phone]     [facility]     [address]     [place], [place]      [phone]       [facility] (for caregivers and family) [phone] [address] [place], [place] [phone]      Residential Treatment Programs [facility]      [address], [place], [place] [address]  [phone]       [facility] [address] [place], [place] [address] [phone]  [facility]        [phone]       [facility] [phone]  [facility] ([facility]             [address]                                         [place], [place]                                                [phone] or [phone]                               [facility] [place] [address] [place], [address] [phone]  [facility]    [address]      [place], [place]     [phone]       [facility] [address] [place], [place] [phone]  [facility]   [address]     [place], [place] [address]     [phone]      Admissions: 8am-3pm M-F  [facility] (RTS) [address] [place], [place] [phone]  [facility] Program: Residential Program [phone](90 Days)     [place], [place]      [phone] or [phone]     [facility]ATC: [facility] [place], [place] (Walk in Hours over the weekend or by referral)  [facility] [address] [place], [place], [place] [address] [phone]  Crisis Mobile: [facility]:  [phone] (for crisis response 24 hours a day) [facility] Hotline:      [phone] Outpatient Psychiatry and Counseling  [facility]: Mobile Crisis Management 24 hours:  [phone]  [facility] of the [employer] sliding scale fee and walk in schedule: M-F 8am-12pm/1pm-3pm [address]  [place], [place] [address] [phone]  [facility] [address] [place], [place] [address] [phone]  [facility] (Formerly known as [facility] [employer])- new patient walk-in appointments available Monday - Friday 8am -3pm.          [address] [place], [place] [address] [phone] or crisis line- [phone]  [facility] Out[facility] Services/ Intensive Outpatient Therapy Program [address] [place], [place] [address] [phone]  [facility]                  Crisis Services      [address]     [place], [place] [address]                 [facility]   [facility] [phone]. [address] [place], [place] [address]   [facility] of Care          [address]  [place], [place] [address]       [phone]  [facility] [address] [place], [place] [address] [phone]  [facility]    [address]    [place], [place] [address]     [phone]       Andee Poles, MD     [address]     [place] [address]     [phone]       [facility] [address] [place] [address]  [facility]     [address]  [place], [place]      [phone]       [facility] Eulogio Ditch, MD [address] [place], [place] [address] [phone]  [facility]     [address]     [place], [place] [address]     [phone]       [facility] [address] [place], [place] [address] [phone] Resources for Temporary Residential Assistance/Crisis Centers  DAY CENTERS [facility]) M-F 8am-3pm   [address] [place], [place] [address]   [phone] Services include: laundry, barbering, support groups, case management, phone  & computer access, showers, AA/NA mtgs, mental health/substance abuse nurse, job skills class, disability information, VA assistance, spiritual classes, etc.   HOMELESS SHELTERS  [facility]     [facility]   [address] [place]     [phone]              [employer] (women and children)       [address] [place], [place] [address] [phone] [email or web address] for application and process Application Required  [facility]   [address]    [place] [address]     [phone]                    [facility] [place] [address] [place], [place] [address] [phone] application appt.) Application Required  [facility] (women only)    [address]     [place], [place] [address]     [phone]  Intake starts 6pm daily Need valid ID, SSC, & Police report [employer] [address] [place], [place] [phone] Application Required  [employer] (men only)     [address]      [place], [place]     [phone]       [facility] [place] (Pregnant women only) [address] [place], [place] [phone]  [facility]      [address]      [place], [place] [address]       [phone]             [facility] [address] [place], [place] [phone] 90 day commitment/SA/Application process  [facility](men only)     [address]     [place], [place]     [phone]       Check-in at [facility] [address] [place], [place] [address] [phone] Men/Women/Women and Children must be there by 7 pm  [facility] [place], [place] [phone]

## 2014-05-08 NOTE — ED Provider Notes (Signed)
CSN: [id number]     Arrival date & time 05/07/14  1937 History   First MD Initiated Contact with Patient 05/08/14 0105     Chief Complaint  Patient presents with  . Agitation     (Consider location/radiation/quality/duration/timing/severity/associated sxs/prior Treatment) HPI Gerald [name] is a 81 y.o. male with past medical history of hypertension, diabetes, hyperlipidemia, presenting today with agitation and possible suicidal behavior. I obtained history from the patient who states that he saw his physician today and he told him he was not allowed to drive. He states his vision was too poor. At that time the patient states that driving is his entire life and at that time he would rather die than not be able to drive. Patient is currently denying any suicidal or homicidal ideations. He has not used any illicit drugs. He was stopped off by his son who is no longer in emergency department. Patient has no medical complaints.  10 Systems reviewed and are negative for acute change except as noted in the HPI.    Past Medical History  Diagnosis Date  . Hypertension   . Dysrhythmia   . Diabetes mellitus without complication   . Hyperlipidemia   . Near syncope 12/23/2012  . PAF (paroxysmal atrial fibrillation)   . Confusion    Past Surgical History  Procedure Laterality Date  . No past surgeries    . Cardioversion N/A 10/31/2012    Procedure: CARDIOVERSION;  Surgeon: Lennette Bihari, MD;  Location: [facility] ENDOSCOPY;  Service: Cardiovascular;  Laterality: N/A;   Family History  Problem Relation Age of Onset  . Heart disease Mother   . Heart failure Mother    History  Substance Use Topics  . Smoking status: Former Smoker    Quit date: 03/06/1978  . Smokeless tobacco: Never Used     Comment: quit smoking about 35 years ago.  . Alcohol Use: No    Review of Systems    Allergies  Doxazosin and Zocor  Home Medications   Prior to Admission medications   Medication Sig Start Date  End Date Taking? Authorizing Provider  apixaban (ELIQUIS) 2.5 MG TABS tablet Take 1 tablet (2.5 mg total) by mouth 2 (two) times daily. 01/15/14  Yes Lennette Bihari, MD  atorvastatin (LIPITOR) 20 MG tablet Take 20 mg by mouth daily.   Yes Historical Provider, MD  folic acid (FOLVITE) 1 MG tablet Take 1 mg by mouth daily.   Yes Historical Provider, MD  glimepiride (AMARYL) 1 MG tablet Take 1 mg by mouth daily before breakfast.   Yes Historical Provider, MD  memantine (NAMENDA) 10 MG tablet Take 1 tablet (10 mg total) by mouth 2 (two) times daily. 01/21/14  Yes Levert Feinstein, MD  metFORMIN (GLUCOPHAGE) 1000 MG tablet Take 1,000 mg by mouth 2 (two) times daily with a meal.   Yes Historical Provider, MD  Multiple Vitamins-Minerals (PRESERVISION AREDS PO) Take 1 tablet by mouth 2 (two) times daily.   Yes Historical Provider, MD  QUEtiapine (SEROQUEL) 25 MG tablet 2 tabs po qhs Patient taking differently: Take 25 mg by mouth at bedtime.  03/25/14  Yes Levert Feinstein, MD  tamsulosin (FLOMAX) 0.4 MG CAPS capsule Take 0.4 mg by mouth daily.    Yes Historical Provider, MD  insulin lispro protamine-lispro (HUMALOG 75/25 MIX) (75-25) 100 UNIT/ML SUSP injection Inject 50 Units into the skin 2 (two) times daily with a meal.    Historical Provider, MD  LORazepam (ATIVAN) 0.5 MG tablet Take 0.5  mg by mouth every 8 (eight) hours as needed for anxiety.  01/09/14   Historical Provider, MD   BP 156/84 mmHg  Pulse 71  Temp(Src) 98.2 F (36.8 C) (Oral)  Resp 16  SpO2 98% Physical Exam  Constitutional: Vital signs are normal. He appears well-developed and well-nourished.  Non-toxic appearance. He does not appear ill. No distress.  HENT:  Head: Normocephalic and atraumatic.  Nose: Nose normal.  Mouth/Throat: Oropharynx is clear and moist. No oropharyngeal exudate.  Eyes: Conjunctivae and EOM are normal. Pupils are equal, round, and reactive to light. No scleral icterus.  Neck: Normal range of motion. Neck supple. No tracheal  deviation, no edema, no erythema and normal range of motion present. No thyroid mass and no thyromegaly present.  Cardiovascular: Normal rate, regular rhythm, S1 normal, S2 normal, normal heart sounds, intact distal pulses and normal pulses.  Exam reveals no gallop and no friction rub.   No murmur heard. Pulses:      Radial pulses are 2+ on the right side, and 2+ on the left side.       Dorsalis pedis pulses are 2+ on the right side, and 2+ on the left side.  Pulmonary/Chest: Effort normal and breath sounds normal. No respiratory distress. He has no wheezes. He has no rhonchi. He has no rales.  Abdominal: Soft. Normal appearance and bowel sounds are normal. He exhibits no distension, no ascites and no mass. There is no hepatosplenomegaly. There is no tenderness. There is no rebound, no guarding and no CVA tenderness.  Musculoskeletal: Normal range of motion. He exhibits no edema or tenderness.  Lymphadenopathy:    He has no cervical adenopathy.  Neurological: He is alert. He has normal strength. No cranial nerve deficit or sensory deficit. He exhibits normal muscle tone.  Normal strength and sensation in all extremities. Normal cerebellar testing.  Skin: Skin is warm, dry and intact. No petechiae and no rash noted. He is not diaphoretic. No erythema. No pallor.  Psychiatric: He has a normal mood and affect. His behavior is normal. Judgment normal.  Nursing note and vitals reviewed.   ED Course  Procedures (including critical care time) Labs Review Labs Reviewed  ACETAMINOPHEN LEVEL - Abnormal; Notable for the following:    Acetaminophen (Tylenol), Serum <10.0 (*)    All other components within normal limits  CBC - Abnormal; Notable for the following:    Hemoglobin 11.5 (*)    HCT 38.1 (*)    All other components within normal limits  COMPREHENSIVE METABOLIC PANEL - Abnormal; Notable for the following:    Glucose, Bld 129 (*)    GFR calc non Af Amer 78 (*)    GFR calc Af Amer 90 (*)     All other components within normal limits  URINALYSIS, ROUTINE W REFLEX MICROSCOPIC - Abnormal; Notable for the following:    APPearance CLOUDY (*)    Glucose, UA 100 (*)    Hgb urine dipstick MODERATE (*)    Leukocytes, UA SMALL (*)    All other components within normal limits  URINE MICROSCOPIC-ADD ON - Abnormal; Notable for the following:    Crystals CA OXALATE CRYSTALS (*)    All other components within normal limits  ETHANOL  SALICYLATE LEVEL  URINE RAPID DRUG SCREEN (HOSP PERFORMED)    Imaging Review No results found.   EKG Interpretation None      MDM   Final diagnoses:  Aggression    Patient since emergency department for agitation and possible  suicidal thoughts. He states he became extremely upset when his physician told him he could not drive. He currently does not have any intentions on killing himself. I asked him directly if he plan a to commit suicidal and he replied no. Patient does not appear to be a threat to himself or others.  Laboratory studies and CT scan are currently pending, they were ordered by triage.    CT scan is not crossing over however I spoke with the radiologist who states there are no acute findings. 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  All studies are unremarkable.  His VS remain within his normal limits and he is safe for DC.  Tomasita Crumble, MD 05/08/14 [phone]

## 2014-05-25 ENCOUNTER — Encounter: Payer: Self-pay | Admitting: Neurology

## 2014-05-25 ENCOUNTER — Ambulatory Visit (INDEPENDENT_AMBULATORY_CARE_PROVIDER_SITE_OTHER): Payer: PPO | Admitting: Neurology

## 2014-05-25 VITALS — BP 149/78 | HR 60 | Ht 71.0 in | Wt 159.0 lb

## 2014-05-25 DIAGNOSIS — R269 Unspecified abnormalities of gait and mobility: Secondary | ICD-10-CM | POA: Diagnosis not present

## 2014-05-25 DIAGNOSIS — R410 Disorientation, unspecified: Secondary | ICD-10-CM

## 2014-05-25 DIAGNOSIS — F0391 Unspecified dementia with behavioral disturbance: Secondary | ICD-10-CM

## 2014-05-25 NOTE — Progress Notes (Addendum)
PATIENT: Gerald [name] DOB: 12/31/1927  HISTORICAL  Gerald DineRichard L Schnieders is a 81 yo RH with his wife and son at today's clinical visit, he is referred by his primary care physician Dr. Andrey CampanileWilson for evaluation of memory trouble confusion,  He has past medical history of hypertension, hyperlipidemia, diabetes, age of fibrillation, on Eliquis,  He has significant family of Alzheimer's disease, his mother, and 3 other siblings suffered dementia in their late 8050s to 10170s.  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, over the past few years, he noticed gradual onset memory trouble, getting worse over the past 2 years, to the point of getting lost while driving, evening time confusion, occasionally visual hallucinations,  He has increased confusion since January first 2016, was brought to the emergency room, laboratory showed elevated glucose, mild anemia, hemoglobin of 11, CT head showed atrophy, ventriculomegaly,  He ambulate with a changed gait, stoop forward, difficulty clear floor, no bowel and bladder incontinence, able to dress, bathing, feed himself without difficulty  He only has 1 son, lives close by,  UPDATE March 17th 2016: He has stop driving since Feb 2016, he lives with his wife, he gets up during night, getting ready to go, very nervous, he is taking nemenda 10mg  bid, serqoeul 25mg , he tend sto feel agitation at evening time, tried seroquel 25 mg half tablets without helping, no significant side effect noticed either  We have reviewed MRI brain,  1. Moderate-severe parietal, perisylvian and mesial temporal atrophy. Moderate ventriculomegaly on ex vacuo basis.  2. Mild periventricular and subcortical chronic small vessel ischemic disease.  3. Right parietal convexity arachnoid cyst (2.7x2.0cm; series 9 image 20).  4. No acute findings.  5. Compared to CT on 01/08/14, no significant change.  UPDATE  May 17th 2016: He is with his son, and daughter-in-law at today's clinical visit, he has increased gait difficulty, increased confusion, agitation at evening time, verbally abusive to his wife. He has decreased appetite, not sleeping well, is taking seroquel 25 mg one tablet in the morning, 2 tablets every night   REVIEW OF SYSTEMS: Full 14 system review of systems performed and notable only for memory loss, confusion ALLERGIES: Allergies  Allergen Reactions  . Doxazosin Rash  . Zocor [Simvastatin] Rash    HOME MEDICATIONS: Current Outpatient Prescriptions on File Prior to Visit  Medication Sig Dispense Refill  . apixaban (ELIQUIS) 2.5 MG TABS tablet Take 1 tablet (2.5 mg total) by mouth 2 (two) times daily. 60 tablet 12  . atorvastatin (LIPITOR) 20 MG tablet Take 20 mg by mouth daily.    . folic acid (FOLVITE) 1 MG tablet Take 1 mg by mouth daily.    Marland Kitchen. glimepiride (AMARYL) 1 MG tablet Take 1 mg by mouth daily before breakfast.    . insulin lispro protamine-lispro (HUMALOG 75/25 MIX) (75-25) 100 UNIT/ML SUSP injection Inject 50 Units into the skin 2 (two) times daily with a meal.    . memantine (NAMENDA) 10 MG tablet Take 1 tablet (10 mg total) by mouth 2 (two) times daily. 60 tablet 11  . metFORMIN (GLUCOPHAGE) 1000 MG tablet Take 1,000 mg by mouth 2 (two) times daily with a meal.    . Multiple Vitamins-Minerals (PRESERVISION AREDS PO) Take 1 tablet by mouth 2 (two) times daily.    . QUEtiapine (SEROQUEL) 25 MG tablet 2 tabs po qhs (Patient taking differently: Taking 25mg  in morning and 50mg   at bedtime.) 60 tablet 6  . tamsulosin (FLOMAX) 0.4 MG CAPS capsule Take 0.4 mg by mouth daily.      No current facility-administered medications on file prior to visit.    PAST MEDICAL HISTORY: Past Medical History  Diagnosis Date  . Hypertension   . Dysrhythmia   . Diabetes mellitus without complication   . Hyperlipidemia   . Near syncope 12/23/2012  . PAF (paroxysmal atrial fibrillation)    . Confusion     PAST SURGICAL HISTORY: Past Surgical History  Procedure Laterality Date  . No past surgeries    . Cardioversion N/A 10/31/2012    Procedure: CARDIOVERSION;  Surgeon: Lennette Biharihomas A Kelly, MD;  [facility];  Service: Cardiovascular;  Laterality: N/A;    FAMILY HISTORY: Family History  Problem Relation Age of Onset  . Heart disease Mother   . Heart failure Mother     SOCIAL HISTORY:  History   Social History  . Marital Status: Married    Spouse Name: N/A  . Number of Children: N/A  . Years of Education: N/A   Occupational History  . Not on file.   Social History Main Topics  . Smoking status: Former Smoker    Quit date: 03/06/1978  . Smokeless tobacco: Never Used     Comment: quit smoking about 35 years ago.  . Alcohol Use: No  . Drug Use: No  . Sexual Activity: Not on file   Other Topics Concern  . Not on file   Social History Narrative   Patient is retired and has one son. Patient is married and lived with his wife.   Education    Caffeine three Diet Dr Reino KentPepper    Right handed.     PHYSICAL EXAM   Filed Vitals:   05/25/14 1321  BP: 149/78  Pulse: 60  Height: 5\' 11"  (1.803 m)  Weight: 159 lb (72.122 kg)    Not recorded      Body mass index is 22.19 kg/(m^2). PHYSICAL EXAMNIATION:  Gen: NAD, conversant, well nourised, obese, well groomed                     Cardiovascular: Regular rate rhythm, no peripheral edema, warm, nontender. Eyes: Conjunctivae clear without exudates or hemorrhage Neck: Supple, no carotid bruise. Pulmonary: Clear to auscultation bilaterally   NEUROLOGICAL EXAM:  MENTAL STATUS: Speech:    Speech is normal; fluent and spontaneous with normal comprehension.  Cognition:   MMSE 18 /30, he is not oriented to date, missed 3 out of 3 recalls, could not spell world backwards, could not copy design, animal naming 5   CRANIAL NERVES: CN II: Visual fields are full to confrontation.Pupils are 4 mm and  briskly reactive to light.  CN III, IV, VI: extraocular movement are normal. No ptosis. CN V: Facial sensation is intact to pinprick in all 3 divisions bilaterally. Corneal responses are intact.  CN VII: Face is symmetric with normal eye closure and smile. CN VIII: Hearing is normal to rubbing fingers CN IX, X: Palate elevates symmetrically. Phonation is normal. CN XI: Head turning and shoulder shrug are intact CN XII: Tongue is midline with normal movements and no atrophy.  MOTOR: Normal tone and bulk, he has mild bilateral ankle dorsiflexion weakness   REFLEXES: Reflexes are 2+ and symmetric at the biceps, triceps, knees, and ankles. Plantar responses are flexor.  SENSORY: Light touch, pinprick, position sense, and vibration sense are intact in fingers and toes.  COORDINATION: Rapid  alternating movements and fine finger movements are intact. There is no dysmetria on finger-to-nose and heel-knee-shin. There are no abnormal or extraneous movements.   GAIT/STANCE: Need to push up on chair arm to get up from seated position, dragging both leg, mildly unsteady,     DIAGNOSTIC DATA (LABS, IMAGING, TESTING) - I reviewed patient records, labs, notes, testing and imaging myself where available.  Lab Results  Component Value Date   WBC 5.1 05/07/2014   HGB 11.5* 05/07/2014   HCT 38.1* 05/07/2014   MCV 89.6 05/07/2014   PLT 156 05/07/2014      Component Value Date/Time   NA 142 05/07/2014 2047   K 4.0 05/07/2014 2047   CL 107 05/07/2014 2047   CO2 28 05/07/2014 2047   GLUCOSE 129* 05/07/2014 2047   BUN 15 05/07/2014 2047   CREATININE 0.84 05/07/2014 2047   CREATININE 0.92 02/11/2013 1128   CALCIUM 8.9 05/07/2014 2047   PROT 6.7 05/07/2014 2047   ALBUMIN 3.8 05/07/2014 2047   AST 18 05/07/2014 2047   ALT 11 05/07/2014 2047   ALKPHOS 105 05/07/2014 2047   BILITOT 0.8 05/07/2014 2047   GFRNONAA 78* 05/07/2014 2047   GFRAA 90* 05/07/2014 2047   Lab Results  Component  Value Date   CHOL 131 09/30/2012   HDL 44 09/30/2012   LDLCALC 70 09/30/2012   TRIG 84 09/30/2012   CHOLHDL 3.0 09/30/2012   Lab Results  Component Value Date   HGBA1C * 03/10/2007    8.3 (NOTE)   The ADA recommends the following therapeutic goals for glycemic   control related to Hgb A1C measurement:   Goal of Therapy:   < 7.0% Hgb A1C   Action Suggested:  > 8.0% Hgb A1C   Ref:  Diabetes Care, 22, Suppl. 1, 1999   Lab Results  Component Value Date   VITAMINB12 376 01/21/2014   Lab Results  Component Value Date   TSH 2.680 01/21/2014      ASSESSMENT AND PLAN  RAYANSH HERBST is a 81 y.o. male complains of gradual onset memory trouble, strong family history of dementia, Mini-Mental Status Examination is 45 out of 30 today, he also has history of atrial fibrillation, hypertension diabetes, hyperlipidemia, on chronic anticoagulation treatment, mild gait difficulty, mild distal weakness.  1. Continue Namenda 10 mg twice a day 2. Increase seroquel 25 mg 1 tablet every morning, 2 tablets every evening for agitations 3. He has gait difficulty, likely a component of lumbar sacral radiculopathy 4. His son is considering assisted living placement,      Levert Feinstein, M.D. Ph.D.  [facility] [address] [place], [place] [address] [phone]

## 2014-05-26 ENCOUNTER — Telehealth: Payer: Self-pay | Admitting: Neurology

## 2014-05-26 NOTE — Telephone Encounter (Signed)
Jennifer, pt's son called requesting to speak with DR. Yan's nurse regarding pt's visit from 05/25/14. Please call and advise. Rykin can be reached @ [phone]

## 2014-05-26 NOTE — Telephone Encounter (Signed)
Son is requesting completion of FL2 forms.  Completed, signed and placed up front for him to pick up.  He is aware that the form is ready.

## 2014-05-26 NOTE — Telephone Encounter (Signed)
Left message for a return call

## 2014-06-29 ENCOUNTER — Ambulatory Visit: Payer: PPO | Admitting: Neurology

## 2014-07-05 ENCOUNTER — Other Ambulatory Visit: Payer: Self-pay

## 2015-05-11 ENCOUNTER — Other Ambulatory Visit: Payer: Self-pay | Admitting: Internal Medicine

## 2015-05-11 DIAGNOSIS — S0190XA Unspecified open wound of unspecified part of head, initial encounter: Secondary | ICD-10-CM

## 2015-05-11 DIAGNOSIS — S066X0A Traumatic subarachnoid hemorrhage without loss of consciousness, initial encounter: Principal | ICD-10-CM

## 2015-05-19 ENCOUNTER — Encounter (HOSPITAL_COMMUNITY): Payer: Self-pay | Admitting: Emergency Medicine

## 2015-05-19 ENCOUNTER — Inpatient Hospital Stay (HOSPITAL_COMMUNITY)
Admission: EM | Admit: 2015-05-19 | Discharge: 2015-05-25 | DRG: 026 | Disposition: A | Discharge status: Skilled Nursing Facility | Payer: PPO | Attending: Neurosurgery | Admitting: Neurosurgery

## 2015-05-19 ENCOUNTER — Ambulatory Visit (HOSPITAL_COMMUNITY)
Admission: RE | Admit: 2015-05-19 | Discharge: 2015-05-19 | Disposition: A | Discharge status: Home / Self Care | Payer: PPO | Source: Ambulatory Visit | Attending: Internal Medicine | Admitting: Internal Medicine

## 2015-05-19 DIAGNOSIS — R531 Weakness: Secondary | ICD-10-CM | POA: Diagnosis not present

## 2015-05-19 DIAGNOSIS — S065X0A Traumatic subdural hemorrhage without loss of consciousness, initial encounter: Principal | ICD-10-CM | POA: Diagnosis present

## 2015-05-19 DIAGNOSIS — Z87891 Personal history of nicotine dependence: Secondary | ICD-10-CM

## 2015-05-19 DIAGNOSIS — S066X0A Traumatic subarachnoid hemorrhage without loss of consciousness, initial encounter: Principal | ICD-10-CM

## 2015-05-19 DIAGNOSIS — E119 Type 2 diabetes mellitus without complications: Secondary | ICD-10-CM | POA: Diagnosis present

## 2015-05-19 DIAGNOSIS — G8194 Hemiplegia, unspecified affecting left nondominant side: Secondary | ICD-10-CM | POA: Diagnosis present

## 2015-05-19 DIAGNOSIS — W19XXXA Unspecified fall, initial encounter: Secondary | ICD-10-CM | POA: Diagnosis present

## 2015-05-19 DIAGNOSIS — I480 Paroxysmal atrial fibrillation: Secondary | ICD-10-CM | POA: Diagnosis present

## 2015-05-19 DIAGNOSIS — S065X9A Traumatic subdural hemorrhage with loss of consciousness of unspecified duration, initial encounter: Secondary | ICD-10-CM

## 2015-05-19 DIAGNOSIS — S065XAA Traumatic subdural hemorrhage with loss of consciousness status unknown, initial encounter: Secondary | ICD-10-CM | POA: Diagnosis present

## 2015-05-19 DIAGNOSIS — E785 Hyperlipidemia, unspecified: Secondary | ICD-10-CM | POA: Diagnosis present

## 2015-05-19 DIAGNOSIS — S0190XA Unspecified open wound of unspecified part of head, initial encounter: Secondary | ICD-10-CM

## 2015-05-19 DIAGNOSIS — I10 Essential (primary) hypertension: Secondary | ICD-10-CM | POA: Diagnosis present

## 2015-05-19 DIAGNOSIS — Z794 Long term (current) use of insulin: Secondary | ICD-10-CM

## 2015-05-19 LAB — CBC WITH DIFFERENTIAL/PLATELET
BASOS ABS: 0 10*3/uL (ref 0.0–0.1)
Basophils Relative: 0 %
Eosinophils Absolute: 0.1 10*3/uL (ref 0.0–0.7)
Eosinophils Relative: 1 %
HCT: 29.3 % — ABNORMAL LOW (ref 39.0–52.0)
HEMOGLOBIN: 9 g/dL — AB (ref 13.0–17.0)
Lymphocytes Relative: 9 %
Lymphs Abs: 0.4 10*3/uL — ABNORMAL LOW (ref 0.7–4.0)
MCH: 26.5 pg (ref 26.0–34.0)
MCHC: 30.7 g/dL (ref 30.0–36.0)
MCV: 86.4 fL (ref 78.0–100.0)
Monocytes Absolute: 0.6 10*3/uL (ref 0.1–1.0)
Monocytes Relative: 12 %
NEUTROS ABS: 4 10*3/uL (ref 1.7–7.7)
Neutrophils Relative %: 78 %
PLATELETS: 149 10*3/uL — AB (ref 150–400)
RBC: 3.39 MIL/uL — ABNORMAL LOW (ref 4.22–5.81)
RDW: 14.6 % (ref 11.5–15.5)
WBC: 5.1 10*3/uL (ref 4.0–10.5)

## 2015-05-19 LAB — BASIC METABOLIC PANEL
ANION GAP: 9 (ref 5–15)
BUN: 26 mg/dL — ABNORMAL HIGH (ref 6–20)
CO2: 27 mmol/L (ref 22–32)
Calcium: 8.5 mg/dL — ABNORMAL LOW (ref 8.9–10.3)
Chloride: 107 mmol/L (ref 101–111)
Creatinine, Ser: 1.08 mg/dL (ref 0.61–1.24)
GFR calc Af Amer: >60 mL/min (ref >60)
GLUCOSE: 96 mg/dL (ref 65–99)
POTASSIUM: 4.3 mmol/L (ref 3.5–5.1)
Sodium: 143 mmol/L (ref 135–145)

## 2015-05-19 LAB — PROTIME-INR
INR: 1.18 (ref 0.00–1.49)
Prothrombin Time: 15.2 seconds (ref 11.6–15.2)

## 2015-05-19 LAB — APTT: APTT: 29 s (ref 24–37)

## 2015-05-19 LAB — VALPROIC ACID LEVEL: VALPROIC ACID LVL: 26 ug/mL — AB (ref 50.0–100.0)

## 2015-05-19 NOTE — ED Notes (Signed)
Brought from [facility] via EMS.  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  [facility].  Results called this evening with new bleed.  Had left sided weakness prior to today but per EMS facility reports that it is worse.  Hx of Afib.

## 2015-05-19 NOTE — ED Provider Notes (Addendum)
Level V caveat dementia. Brought here for reevaluation after head CT performed today as outpatient showed worsening subdural hematoma as compared to prior study. Patient is alert. Follows simple commands. Cranial nerves II through XII grossly intact Motor strength left arm 1/5 left leg 1/5 right arm 5/5 right leg 5 /5  Doug SouSam Ruthanne Mcneish, MD 05/19/15 [id number]  Doug SouSam Mujahid Jalomo, MD 05/20/15 [id number]  Doug SouSam Breion Novacek, MD 05/20/15 0040

## 2015-05-19 NOTE — ED Provider Notes (Signed)
CSN: [id number]     Arrival date & time 05/19/15  2001 History   First MD Initiated Contact with Patient 05/19/15 2004     Chief Complaint  Patient presents with  . Weakness     (Consider location/radiation/quality/duration/timing/severity/associated sxs/prior Treatment) Patient is a 82 y.o. male presenting with general illness. The history is provided by the patient, a relative and medical records.  Illness Location:  Left-sided weakness Severity:  Severe Onset quality:  Gradual Duration:  2 weeks Timing:  Constant Progression:  Worsening Chronicity:  New Context:  History of dementia, recent traumatic bilateral subdural hematomas. Was on Eliquis at the time, has been off since then. Has been in rehabilitation. +Progressive worsening of left-sided weakness. CT head today showed worsening of his subdural hematoma. Sent here. Denies headache or changes in his vision. Associated symptoms: no abdominal pain, no chest pain, no cough, no diarrhea, no fever, no headaches, no nausea, no shortness of breath and no vomiting     Past Medical History  Diagnosis Date  . Hypertension   . Dysrhythmia   . Diabetes mellitus without complication (HCC)   . Hyperlipidemia   . Near syncope 12/23/2012  . PAF (paroxysmal atrial fibrillation) (HCC)   . Confusion    Past Surgical History  Procedure Laterality Date  . No past surgeries    . Cardioversion N/A 10/31/2012    Procedure: CARDIOVERSION;  Surgeon: Lennette Biharihomas A Kelly, MD;  [facility];  Service: Cardiovascular;  Laterality: N/A;   Family History  Problem Relation Age of Onset  . Heart disease Mother   . Heart failure Mother    Social History  Substance Use Topics  . Smoking status: Former Smoker    Quit date: 03/06/1978  . Smokeless tobacco: Never Used     Comment: quit smoking about 35 years ago.  . Alcohol Use: No    Review of Systems  Constitutional: Negative for fever and chills.  Respiratory: Negative for cough and  shortness of breath.   Cardiovascular: Negative for chest pain.  Gastrointestinal: Negative for nausea, vomiting, abdominal pain and diarrhea.  Musculoskeletal: Negative for back pain, neck pain and neck stiffness.  Neurological: Positive for weakness. Negative for facial asymmetry, speech difficulty, light-headedness and headaches.  All other systems reviewed and are negative.     Allergies  Doxazosin and Zocor  Home Medications   Prior to Admission medications   Medication Sig Start Date End Date Taking? Authorizing Provider  atorvastatin (LIPITOR) 20 MG tablet Take 20 mg by mouth daily.   Yes Historical Provider, MD  diltiazem (CARDIZEM CD) 240 MG 24 hr capsule Take 240 mg by mouth daily.   Yes Historical Provider, MD  divalproex (DEPAKOTE SPRINKLE) 125 MG capsule Take 250 mg by mouth daily.   Yes Historical Provider, MD  donepezil (ARICEPT) 10 MG tablet Take 10 mg by mouth at bedtime.   Yes Historical Provider, MD  insulin aspart (NOVOLOG) 100 UNIT/ML injection Inject 4 Units into the skin 3 (three) times daily after meals.   Yes Historical Provider, MD  loperamide (IMODIUM) 2 MG capsule Take 2 mg by mouth as needed for diarrhea or loose stools.   Yes Historical Provider, MD  LORazepam (ATIVAN) 0.5 MG tablet Take 0.5 mg by mouth 2 (two) times daily as needed for anxiety.   Yes Historical Provider, MD  memantine (NAMENDA) 10 MG tablet Take 1 tablet (10 mg total) by mouth 2 (two) times daily. 01/21/14  Yes Levert FeinsteinYijun Yan, MD  permethrin (ELIMITE) 5 %  cream Apply 1 application topically daily.   Yes Historical Provider, MD  tamsulosin (FLOMAX) 0.4 MG CAPS capsule Take 0.4 mg by mouth daily.    Yes Historical Provider, MD  traZODone (DESYREL) 50 MG tablet Take 50 mg by mouth at bedtime.   Yes Historical Provider, MD  apixaban (ELIQUIS) 2.5 MG TABS tablet Take 1 tablet (2.5 mg total) by mouth 2 (two) times daily. 01/15/14   Lennette Bihari, MD  folic acid (FOLVITE) 1 MG tablet Take 1 mg by mouth  daily.    Historical Provider, MD  glimepiride (AMARYL) 1 MG tablet Take 1 mg by mouth daily before breakfast.    Historical Provider, MD  metFORMIN (GLUCOPHAGE) 1000 MG tablet Take 1,000 mg by mouth 2 (two) times daily with a meal.    Historical Provider, MD  Multiple Vitamins-Minerals (PRESERVISION AREDS PO) Take 1 tablet by mouth 2 (two) times daily.    Historical Provider, MD  QUEtiapine (SEROQUEL) 25 MG tablet 2 tabs po qhs Patient taking differently: Taking  in morning and  at bedtime. 03/25/14   Levert Feinstein, MD   BP 113/64 mmHg  Pulse 84  Temp(Src) 98.1 F (36.7 C) (Oral)  Resp 20  Ht  (1.727 m)  Wt 72.122 kg  BMI 24.18 kg/m2  SpO2 96% Physical Exam  Constitutional: He is oriented to person, place, and time. He appears well-developed and well-nourished. No distress.  HENT:  Head: Normocephalic and atraumatic.  Eyes: EOM are normal. Pupils are equal, round, and reactive to light.  Cardiovascular: Normal rate, regular rhythm and intact distal pulses.   Pulmonary/Chest: Effort normal. No respiratory distress.  Abdominal: Soft. There is no tenderness.  Musculoskeletal: He exhibits no edema.  Neurological: He is alert and oriented to person, place, and time. No cranial nerve deficit. GCS eye subscore is 4. GCS verbal subscore is 5. GCS motor subscore is 6.  Left upper extremity and left lower extremity strength 1/5  Skin: Skin is warm and dry.    ED Course  Procedures (including critical care time) Labs Review Labs Reviewed  CBC WITH DIFFERENTIAL/PLATELET - Abnormal; Notable for the following:    RBC 3.39 (*)    Hemoglobin 9.0 (*)    HCT 29.3 (*)    Platelets 149 (*)    Lymphs Abs 0.4 (*)    All other components within normal limits  BASIC METABOLIC PANEL - Abnormal; Notable for the following:    BUN 26 (*)    Calcium 8.5 (*)    All other components within normal limits  VALPROIC ACID LEVEL - Abnormal; Notable for the following:    Valproic Acid Lvl 26 (*)     All other components within normal limits  PROTIME-INR  APTT    Imaging Review Ct Head Wo Contrast  05/19/2015  CLINICAL DATA:  Subarachnoid hemorrhage follow-up. EXAM: CT HEAD WITHOUT CONTRAST TECHNIQUE: Contiguous axial images were obtained from the base of the skull through the vertex without intravenous contrast. COMPARISON:  05/08/2014.  Report from [facility] 04/23/2015 FINDINGS: Skull and Sinuses:Negative for fracture or hemo sinus. Left supraorbital scalp hematoma. Visualized orbits: Negative. Brain: There is a 3 cm thick holo hemispheric right subdural collection with mixed density. High-density areas consistent with membranes or recent blood clot. There is a subdural collection around the left cerebral hemisphere which is high density anteriorly but predominantly low-density, 18 mm in maximal thickness. Both collections have increased in size compared to report from most recent scan in care everywhere. There  is mass effect in the bilateral cerebral hemispheres with prominent flattening on the right at the vertex. Complete effacement of the right lateral ventricle with 8 mm midline shift. No acute infarct. No subarachnoid or intraventricular hemorrhage. Critical Value/emergent results were called by telephone at the time of interpretation on 05/19/2015 at 6:41 pm to Dr. Ebbie Latus, who verbally acknowledged these results. IMPRESSION: 1. Mixed density subdural hematoma on the right measuring 31 mm thickness with mass effect on the cerebral hemisphere and midline shift of 8 mm. This has enlarged from 24mm when correlated with report from [facility] 04/23/2015. 2. Predominately low-density left subdural hematoma measuring up to 18 mm in thickness. Electronically Signed   By: Marnee Spring M.D.   On: 05/19/2015 18:52   I have personally reviewed and evaluated these images and lab results as part of my medical decision-making.   EKG Interpretation None      MDM   Final diagnoses:   Subdural hematoma (HCC)    82 year old male presenting with worsening of his subdural hematoma. Suffered traumatic subdural hematoma a few weeks ago. Was on Eliquisat that time. Has been staying at a rehabilitation facility. Per family report the patient has worsening of his left-sided weakness over this time course. Repeat CT head today showed evidence of worsening of his subdural hematoma. Sent here. Patient has obvious weakness in both left upper and left lower extremity, strength 1 out of 5.  Contacted neurosurgery. They will admit to their service for further intervention and observation.  Patient admitted in stable condition.  Lindalou Hose, MD 05/20/15 8119  Doug Sou, MD 05/20/15 0040

## 2015-05-20 ENCOUNTER — Observation Stay (HOSPITAL_COMMUNITY): Payer: PPO | Admitting: Anesthesiology

## 2015-05-20 ENCOUNTER — Encounter (HOSPITAL_COMMUNITY): Payer: Self-pay | Admitting: Certified Registered Nurse Anesthetist

## 2015-05-20 ENCOUNTER — Encounter (HOSPITAL_COMMUNITY)
Admission: EM | Disposition: A | Discharge status: Skilled Nursing Facility | Payer: Self-pay | Source: Home / Self Care | Attending: Neurosurgery

## 2015-05-20 DIAGNOSIS — S065X0A Traumatic subdural hemorrhage without loss of consciousness, initial encounter: Secondary | ICD-10-CM | POA: Diagnosis present

## 2015-05-20 DIAGNOSIS — E119 Type 2 diabetes mellitus without complications: Secondary | ICD-10-CM | POA: Diagnosis present

## 2015-05-20 DIAGNOSIS — Z794 Long term (current) use of insulin: Secondary | ICD-10-CM | POA: Diagnosis not present

## 2015-05-20 DIAGNOSIS — W19XXXA Unspecified fall, initial encounter: Secondary | ICD-10-CM | POA: Diagnosis present

## 2015-05-20 DIAGNOSIS — I10 Essential (primary) hypertension: Secondary | ICD-10-CM | POA: Diagnosis present

## 2015-05-20 DIAGNOSIS — S065XAA Traumatic subdural hemorrhage with loss of consciousness status unknown, initial encounter: Secondary | ICD-10-CM | POA: Diagnosis present

## 2015-05-20 DIAGNOSIS — E785 Hyperlipidemia, unspecified: Secondary | ICD-10-CM | POA: Diagnosis present

## 2015-05-20 DIAGNOSIS — I480 Paroxysmal atrial fibrillation: Secondary | ICD-10-CM | POA: Diagnosis present

## 2015-05-20 DIAGNOSIS — G8194 Hemiplegia, unspecified affecting left nondominant side: Secondary | ICD-10-CM | POA: Diagnosis present

## 2015-05-20 DIAGNOSIS — S065X9A Traumatic subdural hemorrhage with loss of consciousness of unspecified duration, initial encounter: Secondary | ICD-10-CM | POA: Diagnosis present

## 2015-05-20 DIAGNOSIS — Z87891 Personal history of nicotine dependence: Secondary | ICD-10-CM | POA: Diagnosis not present

## 2015-05-20 DIAGNOSIS — R531 Weakness: Secondary | ICD-10-CM | POA: Diagnosis present

## 2015-05-20 HISTORY — PX: CRANIOTOMY: SHX93

## 2015-05-20 LAB — GLUCOSE, CAPILLARY
GLUCOSE-CAPILLARY: 184 mg/dL — AB (ref 65–99)
Glucose-Capillary: 207 mg/dL — ABNORMAL HIGH (ref 65–99)
Glucose-Capillary: 136 mg/dL — ABNORMAL HIGH (ref 65–99)
Glucose-Capillary: 149 mg/dL — ABNORMAL HIGH (ref 65–99)
Glucose-Capillary: 195 mg/dL — ABNORMAL HIGH (ref 65–99)

## 2015-05-20 LAB — PREPARE RBC (CROSSMATCH)

## 2015-05-20 LAB — ABO/RH: ABO/RH(D): B POS

## 2015-05-20 LAB — MRSA PCR SCREENING: MRSA BY PCR: NEGATIVE

## 2015-05-20 SURGERY — CRANIOTOMY HEMATOMA EVACUATION SUBDURAL
Anesthesia: General | Site: Head | Laterality: Right

## 2015-05-20 MED ORDER — THROMBIN 5000 UNITS EX SOLR
OROMUCOSAL | Status: DC | PRN
  Administered 2015-05-20: 19:00:00 via TOPICAL

## 2015-05-20 MED ORDER — PROSIGHT PO TABS
1.0000 | ORAL_TABLET | Freq: Every day | ORAL | Status: DC
  Administered 2015-05-22 – 2015-05-25 (×4): 1 via ORAL
  Filled 2015-05-20 (×6): qty 1

## 2015-05-20 MED ORDER — ACETAMINOPHEN 650 MG RE SUPP
650.0000 mg | Freq: Four times a day (QID) | RECTAL | Status: DC | PRN
Start: 2015-05-20 — End: 2015-05-25

## 2015-05-20 MED ORDER — FENTANYL CITRATE (PF) 250 MCG/5ML IJ SOLN
INTRAMUSCULAR | Status: AC
  Filled 2015-05-20: qty 5

## 2015-05-20 MED ORDER — HYDROMORPHONE HCL 1 MG/ML IJ SOLN: 0.2500 mg | INTRAMUSCULAR | Status: DC | PRN

## 2015-05-20 MED ORDER — QUETIAPINE FUMARATE 25 MG PO TABS
25.0000 mg | ORAL_TABLET | Freq: Every morning | ORAL | Status: DC
  Administered 2015-05-22 – 2015-05-25 (×4): 25 mg via ORAL
  Filled 2015-05-20 (×5): qty 1

## 2015-05-20 MED ORDER — LIDOCAINE-EPINEPHRINE 1 %-1:100000 IJ SOLN
INTRAMUSCULAR | Status: DC | PRN
  Administered 2015-05-20: 4 mL

## 2015-05-20 MED ORDER — CEFAZOLIN SODIUM-DEXTROSE 2-3 GM-% IV SOLR
INTRAVENOUS | Status: DC | PRN
  Administered 2015-05-20: 2 g via INTRAVENOUS

## 2015-05-20 MED ORDER — ONDANSETRON HCL 4 MG/2ML IJ SOLN: 4.0000 mg | Freq: Four times a day (QID) | INTRAMUSCULAR | Status: DC | PRN

## 2015-05-20 MED ORDER — SODIUM CHLORIDE 0.9 % IV SOLN
INTRAVENOUS | Status: DC
  Administered 2015-05-20 (×2): via INTRAVENOUS

## 2015-05-20 MED ORDER — ATORVASTATIN CALCIUM 10 MG PO TABS
20.0000 mg | ORAL_TABLET | Freq: Every day | ORAL | Status: DC
  Administered 2015-05-22 – 2015-05-25 (×4): 20 mg via ORAL
  Filled 2015-05-20: qty 1
  Filled 2015-05-20: qty 2
  Filled 2015-05-20: qty 1
  Filled 2015-05-20: qty 2

## 2015-05-20 MED ORDER — BUPIVACAINE HCL (PF) 0.5 % IJ SOLN
INTRAMUSCULAR | Status: DC | PRN
  Administered 2015-05-20: 4 mL

## 2015-05-20 MED ORDER — SODIUM CHLORIDE 0.9 % IV SOLN
INTRAVENOUS | Status: DC
  Administered 2015-05-20 – 2015-05-21 (×2): via INTRAVENOUS
  Administered 2015-05-22: 75 mL via INTRAVENOUS
  Administered 2015-05-22 – 2015-05-24 (×3): via INTRAVENOUS

## 2015-05-20 MED ORDER — SENNA 8.6 MG PO TABS
1.0000 | ORAL_TABLET | Freq: Two times a day (BID) | ORAL | Status: DC
  Administered 2015-05-21 – 2015-05-25 (×6): 8.6 mg via ORAL
  Filled 2015-05-20 (×6): qty 1

## 2015-05-20 MED ORDER — CEFAZOLIN SODIUM-DEXTROSE 2-3 GM-% IV SOLR: INTRAVENOUS | Status: DC | PRN

## 2015-05-20 MED ORDER — ONDANSETRON HCL 4 MG PO TABS
4.0000 mg | ORAL_TABLET | Freq: Four times a day (QID) | ORAL | Status: DC | PRN
Start: 2015-05-20 — End: 2015-05-25

## 2015-05-20 MED ORDER — SURGIFOAM 100 EX MISC
CUTANEOUS | Status: DC | PRN
  Administered 2015-05-20: 19:00:00 via TOPICAL

## 2015-05-20 MED ORDER — MEMANTINE HCL 10 MG PO TABS
10.0000 mg | ORAL_TABLET | Freq: Two times a day (BID) | ORAL | Status: DC
  Administered 2015-05-21 – 2015-05-25 (×8): 10 mg via ORAL
  Filled 2015-05-20 (×13): qty 1

## 2015-05-20 MED ORDER — ROCURONIUM BROMIDE 100 MG/10ML IV SOLN
INTRAVENOUS | Status: DC | PRN
  Administered 2015-05-20: 20 mg via INTRAVENOUS
  Administered 2015-05-20: 30 mg via INTRAVENOUS

## 2015-05-20 MED ORDER — INSULIN ASPART 100 UNIT/ML ~~LOC~~ SOLN
0.0000 [IU] | Freq: Three times a day (TID) | SUBCUTANEOUS | Status: DC
  Administered 2015-05-20: 2 [IU] via SUBCUTANEOUS
  Administered 2015-05-20: 5 [IU] via SUBCUTANEOUS
  Administered 2015-05-21 (×2): 2 [IU] via SUBCUTANEOUS
  Administered 2015-05-21: 3 [IU] via SUBCUTANEOUS
  Administered 2015-05-22 – 2015-05-25 (×8): 2 [IU] via SUBCUTANEOUS

## 2015-05-20 MED ORDER — PROMETHAZINE HCL 25 MG PO TABS: 12.5000 mg | ORAL_TABLET | ORAL | Status: DC | PRN

## 2015-05-20 MED ORDER — TRAZODONE HCL 50 MG PO TABS
50.0000 mg | ORAL_TABLET | Freq: Every day | ORAL | Status: DC
  Administered 2015-05-21 – 2015-05-24 (×3): 50 mg via ORAL
  Filled 2015-05-20 (×5): qty 1

## 2015-05-20 MED ORDER — SUGAMMADEX SODIUM 200 MG/2ML IV SOLN
INTRAVENOUS | Status: DC | PRN
  Administered 2015-05-20: 200 mg via INTRAVENOUS

## 2015-05-20 MED ORDER — ALBUMIN HUMAN 5 % IV SOLN
INTRAVENOUS | Status: DC | PRN
  Administered 2015-05-20: 19:00:00 via INTRAVENOUS

## 2015-05-20 MED ORDER — PANTOPRAZOLE SODIUM 40 MG IV SOLR
40.0000 mg | Freq: Every day | INTRAVENOUS | Status: DC
  Administered 2015-05-20 – 2015-05-21 (×2): 40 mg via INTRAVENOUS
  Filled 2015-05-20 (×2): qty 40

## 2015-05-20 MED ORDER — ONDANSETRON HCL 4 MG/2ML IJ SOLN
INTRAMUSCULAR | Status: DC | PRN
  Administered 2015-05-20: 4 mg via INTRAVENOUS

## 2015-05-20 MED ORDER — ONDANSETRON HCL 4 MG/2ML IJ SOLN
INTRAMUSCULAR | Status: AC
  Filled 2015-05-20: qty 2

## 2015-05-20 MED ORDER — LABETALOL HCL 5 MG/ML IV SOLN: 10.0000 mg | INTRAVENOUS | Status: DC | PRN

## 2015-05-20 MED ORDER — DILTIAZEM HCL ER COATED BEADS 240 MG PO CP24
240.0000 mg | ORAL_CAPSULE | Freq: Every day | ORAL | Status: DC
  Administered 2015-05-22 – 2015-05-25 (×3): 240 mg via ORAL
  Filled 2015-05-20 (×5): qty 1

## 2015-05-20 MED ORDER — SUGAMMADEX SODIUM 200 MG/2ML IV SOLN
INTRAVENOUS | Status: AC
  Filled 2015-05-20: qty 2

## 2015-05-20 MED ORDER — SUCCINYLCHOLINE CHLORIDE 20 MG/ML IJ SOLN
INTRAMUSCULAR | Status: DC | PRN
  Administered 2015-05-20: 80 mg via INTRAVENOUS

## 2015-05-20 MED ORDER — GLIMEPIRIDE 1 MG PO TABS
1.0000 mg | ORAL_TABLET | Freq: Every day | ORAL | Status: DC
  Administered 2015-05-22 – 2015-05-25 (×4): 1 mg via ORAL
  Filled 2015-05-20 (×6): qty 1

## 2015-05-20 MED ORDER — DOCUSATE SODIUM 100 MG PO CAPS
100.0000 mg | ORAL_CAPSULE | Freq: Two times a day (BID) | ORAL | Status: DC
  Administered 2015-05-21 – 2015-05-25 (×8): 100 mg via ORAL
  Filled 2015-05-20 (×8): qty 1

## 2015-05-20 MED ORDER — LORAZEPAM 0.5 MG PO TABS: 0.5000 mg | ORAL_TABLET | Freq: Two times a day (BID) | ORAL | Status: DC | PRN

## 2015-05-20 MED ORDER — LOPERAMIDE HCL 2 MG PO CAPS
2.0000 mg | ORAL_CAPSULE | ORAL | Status: DC | PRN
  Filled 2015-05-20: qty 1

## 2015-05-20 MED ORDER — PROPOFOL 10 MG/ML IV BOLUS
INTRAVENOUS | Status: DC | PRN
  Administered 2015-05-20: 80 mg via INTRAVENOUS

## 2015-05-20 MED ORDER — 0.9 % SODIUM CHLORIDE (POUR BTL) OPTIME
TOPICAL | Status: DC | PRN
  Administered 2015-05-20 (×3): 1000 mL

## 2015-05-20 MED ORDER — FOLIC ACID 1 MG PO TABS
1.0000 mg | ORAL_TABLET | Freq: Every day | ORAL | Status: DC
  Administered 2015-05-22 – 2015-05-25 (×4): 1 mg via ORAL
  Filled 2015-05-20 (×4): qty 1

## 2015-05-20 MED ORDER — DIVALPROEX SODIUM 125 MG PO CSDR
250.0000 mg | DELAYED_RELEASE_CAPSULE | Freq: Every day | ORAL | Status: DC
  Administered 2015-05-22 – 2015-05-25 (×4): 250 mg via ORAL
  Filled 2015-05-20 (×5): qty 2

## 2015-05-20 MED ORDER — ACETAMINOPHEN 325 MG PO TABS: 650.0000 mg | ORAL_TABLET | Freq: Four times a day (QID) | ORAL | Status: DC | PRN

## 2015-05-20 MED ORDER — MORPHINE SULFATE (PF) 2 MG/ML IV SOLN
1.0000 mg | INTRAVENOUS | Status: DC | PRN
  Administered 2015-05-20: 1 mg via INTRAVENOUS
  Administered 2015-05-23: 2 mg via INTRAVENOUS
  Filled 2015-05-20 (×3): qty 1

## 2015-05-20 MED ORDER — DONEPEZIL HCL 10 MG PO TABS
10.0000 mg | ORAL_TABLET | Freq: Every day | ORAL | Status: DC
  Administered 2015-05-21 – 2015-05-24 (×4): 10 mg via ORAL
  Filled 2015-05-20 (×4): qty 1

## 2015-05-20 MED ORDER — SODIUM CHLORIDE 0.9 % IV SOLN
1000.0000 mg | Freq: Once | INTRAVENOUS | Status: AC
  Administered 2015-05-20: 1000 mg via INTRAVENOUS
  Filled 2015-05-20: qty 10

## 2015-05-20 MED ORDER — LEVETIRACETAM 500 MG/5ML IV SOLN
500.0000 mg | Freq: Two times a day (BID) | INTRAVENOUS | Status: DC
  Administered 2015-05-21 – 2015-05-24 (×7): 500 mg via INTRAVENOUS
  Filled 2015-05-20 (×10): qty 5

## 2015-05-20 MED ORDER — SODIUM CHLORIDE 0.9 % IR SOLN
Status: DC | PRN
  Administered 2015-05-20: 19:00:00

## 2015-05-20 MED ORDER — LIDOCAINE HCL (CARDIAC) 20 MG/ML IV SOLN
INTRAVENOUS | Status: DC | PRN
  Administered 2015-05-20: 60 mg via INTRAVENOUS

## 2015-05-20 MED ORDER — HYDROCODONE-ACETAMINOPHEN 5-325 MG PO TABS
1.0000 | ORAL_TABLET | ORAL | Status: DC | PRN
Start: 2015-05-20 — End: 2015-05-25

## 2015-05-20 MED ORDER — CEFAZOLIN SODIUM 1-5 GM-% IV SOLN
1.0000 g | Freq: Three times a day (TID) | INTRAVENOUS | Status: AC
  Administered 2015-05-21 (×2): 1 g via INTRAVENOUS
  Filled 2015-05-20 (×2): qty 50

## 2015-05-20 MED ORDER — ARTIFICIAL TEARS OP OINT
TOPICAL_OINTMENT | OPHTHALMIC | Status: DC | PRN
  Administered 2015-05-20: 1 via OPHTHALMIC

## 2015-05-20 MED ORDER — SODIUM CHLORIDE 0.9 % IV SOLN: INTRAVENOUS | Status: DC | PRN

## 2015-05-20 MED ORDER — METFORMIN HCL 500 MG PO TABS
1000.0000 mg | ORAL_TABLET | Freq: Two times a day (BID) | ORAL | Status: DC
  Administered 2015-05-22 – 2015-05-25 (×5): 1000 mg via ORAL
  Filled 2015-05-20 (×6): qty 2

## 2015-05-20 MED ORDER — SODIUM CHLORIDE 0.9 % IV SOLN
10.0000 mg | INTRAVENOUS | Status: DC | PRN
  Administered 2015-05-20: 20 ug/min via INTRAVENOUS

## 2015-05-20 MED ORDER — FENTANYL CITRATE (PF) 100 MCG/2ML IJ SOLN
INTRAMUSCULAR | Status: DC | PRN
  Administered 2015-05-20 (×3): 50 ug via INTRAVENOUS

## 2015-05-20 MED ORDER — TAMSULOSIN HCL 0.4 MG PO CAPS
0.4000 mg | ORAL_CAPSULE | Freq: Every day | ORAL | Status: DC
  Administered 2015-05-22 – 2015-05-25 (×4): 0.4 mg via ORAL
  Filled 2015-05-20 (×4): qty 1

## 2015-05-20 MED ORDER — INSULIN ASPART 100 UNIT/ML ~~LOC~~ SOLN
4.0000 [IU] | Freq: Three times a day (TID) | SUBCUTANEOUS | Status: DC
  Administered 2015-05-22 – 2015-05-25 (×9): 4 [IU] via SUBCUTANEOUS

## 2015-05-20 SURGICAL SUPPLY — 82 items
APL SKNCLS STERI-STRIP NONHPOA (GAUZE/BANDAGES/DRESSINGS)
BATTERY IQ STERILE (MISCELLANEOUS) ×2 IMPLANT
BENZOIN TINCTURE PRP APPL 2/3 (GAUZE/BANDAGES/DRESSINGS) IMPLANT
BLADE CLIPPER SURG (BLADE) ×3 IMPLANT
BLADE ULTRA TIP 2M (BLADE) ×3 IMPLANT
BNDG GAUZE ELAST 4 BULKY (GAUZE/BANDAGES/DRESSINGS) IMPLANT
BRUSH SCRUB EZ 1% IODOPHOR (MISCELLANEOUS) ×3 IMPLANT
BTRY SRG DRVR 1.5 IQ (MISCELLANEOUS) ×1
BUR ACORN 6.0 PRECISION (BURR) ×2 IMPLANT
BUR ACORN 6.0MM PRECISION (BURR) ×1
BUR ADDG 1.1 (BURR) IMPLANT
BUR ADDG 1.1MM (BURR)
BUR MATCHSTICK NEURO 3.0 LAGG (BURR) IMPLANT
BUR SPIRAL ROUTER 2.3 (BUR) ×1 IMPLANT
BUR SPIRAL ROUTER 2.3MM (BUR) ×1
CANISTER SUCT 3000ML PPV (MISCELLANEOUS) ×3 IMPLANT
CLIP TI MEDIUM 6 (CLIP) IMPLANT
DRAIN CHANNEL 10M FLAT 3/4 FLT (DRAIN) ×2 IMPLANT
DRAIN SNY WOU 7FLT (WOUND CARE) IMPLANT
DRAPE NEUROLOGICAL W/INCISE (DRAPES) ×3 IMPLANT
DRAPE SURG 17X23 STRL (DRAPES) IMPLANT
DRAPE WARM FLUID 44X44 (DRAPE) ×3 IMPLANT
DRSG TELFA 3X8 NADH (GAUZE/BANDAGES/DRESSINGS) ×3 IMPLANT
DURAMATRIX ONLAY 3X3 (Plate) ×2 IMPLANT
DURAPREP 6ML APPLICATOR 50/CS (WOUND CARE) ×3 IMPLANT
ELECT CAUTERY BLADE 6.4 (BLADE) ×3 IMPLANT
ELECT REM PT RETURN 9FT ADLT (ELECTROSURGICAL) ×3
ELECTRODE REM PT RTRN 9FT ADLT (ELECTROSURGICAL) ×1 IMPLANT
EVACUATOR 1/8 PVC DRAIN (DRAIN) IMPLANT
EVACUATOR SILICONE 100CC (DRAIN) ×2 IMPLANT
GAUZE SPONGE 4X4 12PLY STRL (GAUZE/BANDAGES/DRESSINGS) ×3 IMPLANT
GAUZE SPONGE 4X4 16PLY XRAY LF (GAUZE/BANDAGES/DRESSINGS) IMPLANT
GLOVE BIOGEL PI IND STRL 7.5 (GLOVE) ×1 IMPLANT
GLOVE BIOGEL PI INDICATOR 7.5 (GLOVE) ×2
GLOVE ECLIPSE 7.0 STRL STRAW (GLOVE) ×6 IMPLANT
GLOVE EXAM NITRILE LRG STRL (GLOVE) IMPLANT
GLOVE EXAM NITRILE MD LF STRL (GLOVE) IMPLANT
GLOVE EXAM NITRILE XL STR (GLOVE) IMPLANT
GLOVE EXAM NITRILE XS STR PU (GLOVE) IMPLANT
GOWN STRL REUS W/ TWL LRG LVL3 (GOWN DISPOSABLE) ×2 IMPLANT
GOWN STRL REUS W/ TWL XL LVL3 (GOWN DISPOSABLE) IMPLANT
GOWN STRL REUS W/TWL 2XL LVL3 (GOWN DISPOSABLE) IMPLANT
GOWN STRL REUS W/TWL LRG LVL3 (GOWN DISPOSABLE) ×6
GOWN STRL REUS W/TWL XL LVL3 (GOWN DISPOSABLE)
HEMOSTAT POWDER KIT SURGIFOAM (HEMOSTASIS) ×3 IMPLANT
HEMOSTAT SURGICEL 2X14 (HEMOSTASIS) IMPLANT
KIT BASIN OR (CUSTOM PROCEDURE TRAY) ×3 IMPLANT
KIT ROOM TURNOVER OR (KITS) ×3 IMPLANT
NDL HYPO 25X1 1.5 SAFETY (NEEDLE) ×1 IMPLANT
NEEDLE HYPO 25X1 1.5 SAFETY (NEEDLE) ×3 IMPLANT
NS IRRIG 1000ML POUR BTL (IV SOLUTION) ×3 IMPLANT
PACK CRANIOTOMY (CUSTOM PROCEDURE TRAY) ×3 IMPLANT
PAD DRESSING TELFA 3X8 NADH (GAUZE/BANDAGES/DRESSINGS) IMPLANT
PATTIES SURGICAL .5 X.5 (GAUZE/BANDAGES/DRESSINGS) IMPLANT
PATTIES SURGICAL .5 X3 (DISPOSABLE) IMPLANT
PATTIES SURGICAL 1X1 (DISPOSABLE) IMPLANT
PLATE 1.5  2HOLE LNG NEURO (Plate) ×4 IMPLANT
PLATE 1.5 2HOLE LNG NEURO (Plate) IMPLANT
PLATE 1.5/0.5 18.5MM BURR HOLE (Plate) ×2 IMPLANT
SCREW SELF DRILL HT 1.5/4MM (Screw) ×18 IMPLANT
SPONGE NEURO XRAY DETECT 1X3 (DISPOSABLE) IMPLANT
SPONGE SURGIFOAM ABS GEL 100 (HEMOSTASIS) ×3 IMPLANT
STAPLER VISISTAT 35W (STAPLE) ×3 IMPLANT
STOCKINETTE 6  STRL (DRAPES)
STOCKINETTE 6 STRL (DRAPES) ×1 IMPLANT
SUT ETHILON 3 0 FSL (SUTURE) IMPLANT
SUT ETHILON 3 0 PS 1 (SUTURE) IMPLANT
SUT NURALON 4 0 TR CR/8 (SUTURE) ×5 IMPLANT
SUT PL GUT 3 0 FS 1 (SUTURE) IMPLANT
SUT STEEL 0 (SUTURE)
SUT STEEL 0 18XMFL TIE 17 (SUTURE) IMPLANT
SUT VIC AB 0 CT1 18XCR BRD8 (SUTURE) ×2 IMPLANT
SUT VIC AB 0 CT1 8-18 (SUTURE) ×3
SUT VIC AB 3-0 SH 8-18 (SUTURE) ×4 IMPLANT
SYR CONTROL 10ML LL (SYRINGE) ×4 IMPLANT
TOWEL OR 17X24 6PK STRL BLUE (TOWEL DISPOSABLE) ×3 IMPLANT
TOWEL OR 17X26 10 PK STRL BLUE (TOWEL DISPOSABLE) ×3 IMPLANT
TRAY FOLEY W/METER SILVER 14FR (SET/KITS/TRAYS/PACK) ×1 IMPLANT
TUBE CONNECTING 12'X1/4 (SUCTIONS) ×1
TUBE CONNECTING 12X1/4 (SUCTIONS) ×2 IMPLANT
UNDERPAD 30X30 INCONTINENT (UNDERPADS AND DIAPERS) ×1 IMPLANT
WATER STERILE IRR 1000ML POUR (IV SOLUTION) ×3 IMPLANT

## 2015-05-20 NOTE — Anesthesia Preprocedure Evaluation (Signed)
Anesthesia Evaluation  Patient identified by MRN, date of birth, ID band Patient awake    Reviewed: Allergy & Precautions, NPO status , Patient's Chart, lab work & pertinent test results  Airway Mallampati: II  TM Distance: >3 FB Neck ROM: Full    Dental no notable dental hx.    Pulmonary neg pulmonary ROS, former smoker,    Pulmonary exam normal breath sounds clear to auscultation       Cardiovascular hypertension, Pt. on medications + dysrhythmias Atrial Fibrillation  Rhythm:Irregular Rate:Normal     Neuro/Psych Mild dementia negative psych ROS   GI/Hepatic negative GI ROS, Neg liver ROS,   Endo/Other  diabetes  Renal/GU negative Renal ROS  negative genitourinary   Musculoskeletal negative musculoskeletal ROS (+)   Abdominal   Peds negative pediatric ROS (+)  Hematology negative hematology ROS (+)   Anesthesia Other Findings   Reproductive/Obstetrics negative OB ROS                             Anesthesia Physical Anesthesia Plan  ASA: III  Anesthesia Plan: General   Post-op Pain Management:    Induction: Intravenous  Airway Management Planned: Oral ETT  Additional Equipment:   Intra-op Plan:   Post-operative Plan: Possible Post-op intubation/ventilation  Informed Consent: I have reviewed the patients History and Physical, chart, labs and discussed the procedure including the risks, benefits and alternatives for the proposed anesthesia with the patient or authorized representative who has indicated his/her understanding and acceptance.   Dental advisory given  Plan Discussed with: CRNA and Surgeon  Anesthesia Plan Comments:         Anesthesia Quick Evaluation

## 2015-05-20 NOTE — Progress Notes (Signed)
I have spoken with the patient's son this morning. After discussion between the patient and his family, they have decided to proceed with craniotomy for evacuation. All questions were answered. We did again review the possible outcomes. We will plan on surgery later this afternoon.

## 2015-05-20 NOTE — Progress Notes (Signed)
Pt to OR at this time.  Son and pastor at bedside.

## 2015-05-20 NOTE — H&P (Signed)
CC:  Chief Complaint  Patient presents with  . Weakness    HPI: Gerald [name] is a 82 y.o. male who is brought to the ED by EMS with family. His history begins about 4 weeks ago when he suffered a fall at his assisted living facility. He was initially taken to [facility] where he was found to have a subdural hematoma. The decision was made to manage non-operatively, and he was discharged to SNF at [facility]. Since the fall, he and his family not severe weakness of the left arm and leg. He has been unable to walk unassisted. His family however believe that his weakness has actually worsened somewhat. It was recommended that he undergo repeat CT scan after discharge from [facility], and during therapy today the therapists were apparently concerned about worsening weakness. He was therefore taken for CT at [facility] which demonstrated large right SDH and he was transferred to [facility].  PMH: Past Medical History  Diagnosis Date  . Hypertension   . Dysrhythmia   . Diabetes mellitus without complication (HCC)   . Hyperlipidemia   . Near syncope 12/23/2012  . PAF (paroxysmal atrial fibrillation) (HCC)   . Confusion     PSH: Past Surgical History  Procedure Laterality Date  . No past surgeries    . Cardioversion N/A 10/31/2012    Procedure: CARDIOVERSION;  Surgeon: Lennette Bihari, MD;  Location: [facility] ENDOSCOPY;  Service: Cardiovascular;  Laterality: N/A;    SH: Social History  Substance Use Topics  . Smoking status: Former Smoker    Quit date: 03/06/1978  . Smokeless tobacco: Never Used     Comment: quit smoking about 35 years ago.  . Alcohol Use: No    MEDS: Prior to Admission medications   Medication Sig Start Date End Date Taking? Authorizing Provider  atorvastatin (LIPITOR) 20 MG tablet Take 20 mg by mouth daily.   Yes Historical Provider, MD  diltiazem (CARDIZEM CD) 240 MG 24 hr capsule Take 240 mg by mouth daily.   Yes Historical Provider, MD  divalproex (DEPAKOTE SPRINKLE)  125 MG capsule Take 250 mg by mouth daily.   Yes Historical Provider, MD  donepezil (ARICEPT) 10 MG tablet Take 10 mg by mouth at bedtime.   Yes Historical Provider, MD  insulin aspart (NOVOLOG) 100 UNIT/ML injection Inject 4 Units into the skin 3 (three) times daily after meals.   Yes Historical Provider, MD  loperamide (IMODIUM) 2 MG capsule Take 2 mg by mouth as needed for diarrhea or loose stools.   Yes Historical Provider, MD  LORazepam (ATIVAN) 0.5 MG tablet Take 0.5 mg by mouth 2 (two) times daily as needed for anxiety.   Yes Historical Provider, MD  memantine (NAMENDA) 10 MG tablet Take 1 tablet (10 mg total) by mouth 2 (two) times daily. 01/21/14  Yes Levert Feinstein, MD  permethrin (ELIMITE) 5 % cream Apply 1 application topically daily.   Yes Historical Provider, MD  tamsulosin (FLOMAX) 0.4 MG CAPS capsule Take 0.4 mg by mouth daily.    Yes Historical Provider, MD  traZODone (DESYREL) 50 MG tablet Take 50 mg by mouth at bedtime.   Yes Historical Provider, MD  apixaban (ELIQUIS) 2.5 MG TABS tablet Take 1 tablet (2.5 mg total) by mouth 2 (two) times daily. 01/15/14   Lennette Bihari, MD  folic acid (FOLVITE) 1 MG tablet Take 1 mg by mouth daily.    Historical Provider, MD  glimepiride (AMARYL) 1 MG tablet Take 1 mg by mouth daily  before breakfast.    Historical Provider, MD  metFORMIN (GLUCOPHAGE) 1000 MG tablet Take 1,000 mg by mouth 2 (two) times daily with a meal.    Historical Provider, MD  Multiple Vitamins-Minerals (PRESERVISION AREDS PO) Take 1 tablet by mouth 2 (two) times daily.    Historical Provider, MD  QUEtiapine (SEROQUEL) 25 MG tablet 2 tabs po qhs Patient taking differently: Taking 25mg  in morning and 50mg  at bedtime. 03/25/14   Levert FeinsteinYijun Yan, MD    ALLERGY: Allergies  Allergen Reactions  . Doxazosin Rash  . Zocor [Simvastatin] Rash    ROS: ROS  NEUROLOGIC EXAM: Awake, alert, slightly confused Speech fluent,  CN grossly intact Motor exam:  5/5 RUE  Minimal voluntary  movement LUE  5/5 RLE  3/5 proximal LLE, 2/5 distal LLE Sensation grossly intact to LT  IMGAING: CTH reviewed which demonstrates a large holohemispheric right subacute SDH with sulcal effacement and R->L MLS of 7mm. There is left sided likely hygroma without sulcal effacement.  IMPRESSION: - 82 y.o. male s/p fall 4 weeks ago with worsening dense left hemiparesis and enlarging right subacute SDH. Given his age, current level of function, and underlying medical conditions, I certainly think he has a decreased life expectancy. Unfortunately he appears to have failed non-operative treatment and at this point evacuation would be reasonable.  PLAN: - The patient's family would like to consider surgical evacuation. In the meantime, we will admit for observation.  - Will leave him NPO p MN  I did review the CT findings with the patient and his family. Options for treatment at this point were discussed including continued non-op treatment and the risk of continued enlargement and possible death, vs surgical evacuation. Risks of surgery were discussed including conversion to acute SDH, need for additional surgeries, worsening weakness, death. I will check with them in the am about operative v continued non-op treatment. All questions were answered.

## 2015-05-20 NOTE — Anesthesia Postprocedure Evaluation (Signed)
Anesthesia Post Note  Patient: Gerald DineRichard L Streight  Procedure(s) Performed: Procedure(s) (LRB): Right Caniotomy for Evacuation of Subdural Hematoma (Right)  Patient location during evaluation: PACU Anesthesia Type: General Level of consciousness: confused Pain management: pain level controlled Vital Signs Assessment: post-procedure vital signs reviewed and stable Respiratory status: spontaneous breathing, nonlabored ventilation, respiratory function stable and patient connected to nasal cannula oxygen Cardiovascular status: stable Postop Assessment: no signs of nausea or vomiting Anesthetic complications: no    Last Vitals:  Filed Vitals:   05/20/15 2045 05/20/15 2100  BP: 135/121 127/78  Pulse: 61 64  Temp:    Resp: 15 13    Last Pain:  Filed Vitals:   05/20/15 2101  PainSc: 0-No pain                 Yarissa Reining S

## 2015-05-20 NOTE — Progress Notes (Signed)
Arrived from ED. Alert and oriented to  self, place, and time. Denies any pain. Oriented to room. Call light within reach.

## 2015-05-20 NOTE — Care Management Note (Signed)
Case Management Note  Patient Details  Name: Tito DineRichard L Knittle MRN: [id number] Date of Birth: Feb 06, 1926  Subjective/Objective:     Patient admitted with subdural hematoma. He is from [facility].                Action/Plan: Pt to have Craniotomy today. CM will follow for discharge needs.   Expected Discharge Date:                  Expected Discharge Plan:  Skilled Nursing Facility  In-House Referral:     Discharge planning Services     Post Acute Care Choice:    Choice offered to:     DME Arranged:    DME Agency:     HH Arranged:    HH Agency:     Status of Service:  In process, will continue to follow  Medicare Important Message Given:    Date Medicare IM Given:    Medicare IM give by:    Date Additional Medicare IM Given:    Additional Medicare Important Message give by:     If discussed at Long Length of Stay Meetings, dates discussed:    Additional Comments:  Kermit BaloKelli F Camaron Cammack, RN 05/20/2015, 2:24 PM

## 2015-05-20 NOTE — Op Note (Signed)
PREOP DIAGNOSIS: Right subacute subdural hematoma  POSTOP DIAGNOSIS: Same  PROCEDURE: 1. Right frontal craniotomy for evacuation of subdural hematoma  SURGEON: Dr. Lisbeth RenshawNeelesh Dene Nazir, MD  ASSISTANT: Dr. Donalee CitrinGary Cram, MD  ANESTHESIA: General Endotracheal  EBL: 400cc  SPECIMENS: None  DRAINS: Subgaleal Blake drain  COMPLICATIONS: None immediate  CONDITION: Hemodynamically stable to recovery  HISTORY: Gerald DineRichard L [name] is a 82 y.o. male presenting to the ED last night with progressively worsening left-sided weakness after a fall about 4 weeks ago. [facility], but elected not to undergo surgery at that time for a right SDH. Repeat scan here demonstrated enlargement in comparison to prior report. He and his family therefore elected to proceed with evacuation. Risks and benefits were reviewed. After all questions were answered, consent was obtained.  PROCEDURE IN DETAIL: After informed consent was obtained and witnessed, the patient was brought to the operating room. After induction of general anesthesia, the patient was positioned on the operative table in the supine position. All pressure points were meticulously padded. Skin incision was then marked out and prepped and draped in the usual sterile fashion.  After timeout was conducted, the skin incision was infiltrated with local anesthetic. Skin incision was then made sharply, and Bovie electrocautery was used to dissect the subcutaneous tissue and the galea was incised. Hemostasis was achieved on the skin edges with Raney clips. Bur holes were then created and a craniotomy was fashioned and elevated. Hemostasis was then achieved on the dural surface with bipolar electrocautery. The dura was then opened in cruciate fashion with monopolar.  The subacute subdural was immediately encountered after the superficial membrane was incised with the dura. Dark crimson subacute blood was evacuated under pressure. There was also  blood clot which was easily evacuated with suction and irrigation. No active bleeding was identified. The brain was significantly compressed and did not re-expand to fill the subdural space.  At this point the wound is irrigated with copious amounts of normal saline irrigation. Good hemostasis was confirmed on the brain surface. The dura was then re-approximated using a combination of interrupted 4-0 Nurolon stitches after a subdural drain was placed and tunneled subcutaneously.  A piece of duramatrix was then placed over the dural surface. Bone flap was replaced and plated with standard plates and screws. The galea was closed using interrupted 3-0 Vicryl sutures. The skin was closed using standard surgical skin staples. Sterile dressing was then applied. The patient was then transferred to the stretcher and taken to the PACU in stable hemodynamic condition.  At the end of the case all sponge, needle, cottonoid, and instrument counts were correct.

## 2015-05-20 NOTE — Clinical Social Work Note (Signed)
Clinical Social Work Assessment  Patient Details  Name: Gerald [name] MRN: [id number] Date of Birth: 07/04/1926  Date of referral:  05/20/15               Reason for consult:  Discharge Planning                Permission sought to share information with:  Family Supports, Facility Medical sales representativeContact Representative, Case Manager Permission granted to share information::  Yes, Verbal Permission Granted  Name::      (Gerald [name]. )  Agency::   Joetta Manners([facility] )  Relationship::   (Son)  Contact Information:   [phone])  Housing/Transportation Living arrangements for the past 2 months:  Assisted Living Facility, Skilled Nursing Facility Source of Information:  Patient Patient Interpreter Needed:  None Criminal Activity/Legal Involvement Pertinent to Current Situation/Hospitalization:  No - Comment as needed Significant Relationships:  Adult Children Lives with:  Facility Resident Do you feel safe going back to the place where you live?  Yes Need for family participation in patient care:  Yes (Comment)  Care giving concerns:  Patient admitted from SN[facility] where he has been a short-term rehab resident for the past 2 weeks.    Social Worker assessment / plan: Visual merchandiserClinical Social Worker spoke with patient's son, Gerald [name] in reference to post-acute placement/ pt's return to [facility] once medically stable for d/c. CSW introduced CSW role. Pt's son reported that patient has been at facility for the past 2 weeks however he has not seen much improvement with rehab services. Pt's son confirmed that patient is a LTC resident from ALF, [facility] with his wife. Pt's son is aware that patient is scheduled to have surgery (craniotomy) this afternoon. Pt's son is agreeable to patient's return to [facility] once stable for d/c. No further concerns reported by family.   CSW was able to confirm PASARR with [place] MUST however DOB is  incorrect and needs to be updated in [place] [facility] system. CSW has notified [facility] and plans to send valid documentation once received from pt's son. CSW will continue to follow patient and pt's family for continued support and to facilitate pt's d/c needs once medically stable for d/c.   Employment status:  Retired Database administratornsurance information:  Managed Medicare PT Recommendations:  Not assessed at this time Information / Referral to community resources:  Skilled Nursing Facility  Patient/Family's Response to care:  Pt disoriented. Pt's son agreeable to return to [facility] once stable. Pt's son supportive, involved in pt's care, and appreciated social work intervention. CSW has encouraged pt's son to contact CSW as needed.   Patient/Family's Understanding of and Emotional Response to Diagnosis, Current Treatment, and Prognosis:  Family knowledgeable of medical and surgical intervention to take place this afternoon.  Emotional Assessment Appearance:  Appears stated age Attitude/Demeanor/Rapport:  Unable to Assess Affect (typically observed):  Unable to Assess Orientation:  Oriented to Self Alcohol / Substance use:  Not Applicable Psych involvement (Current and /or in the community):  No (Comment)  Discharge Needs  Concerns to be addressed:  Denies Needs/Concerns at this time Readmission within the last 30 days:  Yes Current discharge risk:  Dependent with Mobility Barriers to Discharge:  Continued Medical Work up   The Sherwin-WilliamsBashira Elic [name], MSW, LCSWA [phone] 05/20/2015 1:57 PM

## 2015-05-20 NOTE — Anesthesia Procedure Notes (Signed)
Procedure Name: Intubation Date/Time: 05/20/2015 6:24 PM Performed by: Bobbie StackANDERSON, Danyka Merlin KIRSTEN Pre-anesthesia Checklist: Patient identified, Timeout performed, Emergency Drugs available, Suction available and Patient being monitored Patient Re-evaluated:Patient Re-evaluated prior to inductionOxygen Delivery Method: Circle system utilized Preoxygenation: Pre-oxygenation with 100% oxygen Intubation Type: IV induction Ventilation: Mask ventilation without difficulty and Oral airway inserted - appropriate to patient size Laryngoscope Size: Hyacinth MeekerMiller and 2 Grade View: Grade I Tube type: Oral Tube size: 7.5 mm Number of attempts: 1 Airway Equipment and Method: Stylet Placement Confirmation: ETT inserted through vocal cords under direct vision,  breath sounds checked- equal and bilateral and positive ETCO2 Secured at: 23 cm Tube secured with: Tape Dental Injury: Teeth and Oropharynx as per pre-operative assessment

## 2015-05-20 NOTE — Transfer of Care (Signed)
Immediate Anesthesia Transfer of Care Note  Patient: Gerald [name]  Procedure(s) Performed: Procedure(s): Right Caniotomy for Evacuation of Subdural Hematoma (Right)  Patient Location: PACU  Anesthesia Type:General  Level of Consciousness: sedated, lethargic and responds to stimulation  Airway & Oxygen Therapy: Patient Spontanous Breathing and Patient connected to face mask oxygen  Post-op Assessment: Report given to RN and Post -op Vital signs reviewed and stable  Post vital signs: Reviewed and stable  Last Vitals:  Filed Vitals:   05/20/15 1413 05/20/15 1710  BP: 122/68 145/94  Pulse: 77 110  Temp: 36.9 C 37.1 C  Resp: 18 20    Last Pain:  Filed Vitals:   05/20/15 2007  PainSc: 0-No pain         Complications: No apparent anesthesia complications

## 2015-05-20 NOTE — NC FL2 (Signed)
Protection MEDICAID FL2 LEVEL OF CARE SCREENING TOOL     IDENTIFICATION  Patient Name: Gerald [name] Birthdate: May 25, 1926 Sex: male Admission Date (Current Location): 05/19/2015  [facility] [place]   Facility and Address:  [facility], [address], [place], [place] [address]      Provider Number: [id number]  Attending Physician Name and Address:  Lisbeth RenshawNeelesh Nundkumar, MD  Relative Name and Phone Number:       Current Level of Care: Hospital Recommended Level of Care: Skilled Nursing Facility Prior Approval Number:    Date Approved/Denied:   PASRR Number:  [id number]  Discharge Plan: SNF    Current Diagnoses: Patient Active Problem List   Diagnosis Date Noted  . Subdural hematoma (HCC) 05/20/2015  . Dementia 01/21/2014  . Confusion   . Asbestosis by CXR 12/24/2012  . Permanent atrial fibrillation- Rythmol stopped 12/23/2012  . Near syncope 12/23/12 - back in AF with VR 45-85 12/23/2012  . Tachy-brady syndrome (HCC) 12/23/2012  . Hypertension 11/14/2012  . Chronic anticoagulation 11/14/2012  . Hyperlipidemia 09/26/2012  . DM2 (diabetes mellitus, type 2) (HCC) 09/26/2012    Orientation RESPIRATION BLADDER Height & Weight     Self  Normal Incontinent Weight: 155 lb (70.308 kg) Height:  5\' 9"  (175.3 cm)  BEHAVIORAL SYMPTOMS/MOOD NEUROLOGICAL BOWEL NUTRITION STATUS   (None )  (None ) Continent Diet (Currently NPO due to surgical procedure )  AMBULATORY STATUS COMMUNICATION OF NEEDS Skin   Extensive Assist Verbally Normal                       Personal Care Assistance Level of Assistance  Bathing, Feeding, Dressing Bathing Assistance: Limited assistance Feeding assistance: Independent Dressing Assistance: Limited assistance     Functional Limitations Info  Sight, Hearing, Speech Sight Info: Adequate Hearing Info: Adequate Speech Info: Adequate    SPECIAL CARE FACTORS FREQUENCY  PT (By licensed PT)      PT Frequency: 3 OT Frequency: 2            Contractures      Additional Factors Info  Code Status, Allergies Code Status Info: FULL CODE  Allergies Info: Doxazosin, Zocor           Current Medications (05/20/2015):  This is the current hospital active medication list Current Facility-Administered Medications  Medication Dose Route Frequency Provider Last Rate Last Dose  . 0.9 %  sodium chloride infusion   Intravenous Continuous Lisbeth RenshawNeelesh Nundkumar, MD 75 mL/hr at 05/20/15 0305    . acetaminophen (TYLENOL) tablet 650 mg  650 mg Oral Q6H PRN Lisbeth RenshawNeelesh Nundkumar, MD       Or  . acetaminophen (TYLENOL) suppository 650 mg  650 mg Rectal Q6H PRN Lisbeth RenshawNeelesh Nundkumar, MD      . atorvastatin (LIPITOR) tablet 20 mg  20 mg Oral Daily Lisbeth RenshawNeelesh Nundkumar, MD      . diltiazem (CARDIZEM CD) 24 hr capsule 240 mg  240 mg Oral Daily Lisbeth RenshawNeelesh Nundkumar, MD      . divalproex (DEPAKOTE SPRINKLE) capsule 250 mg  250 mg Oral Daily Lisbeth RenshawNeelesh Nundkumar, MD      . docusate sodium (COLACE) capsule 100 mg  100 mg Oral BID Lisbeth RenshawNeelesh Nundkumar, MD   100 mg at 05/20/15 1000  . donepezil (ARICEPT) tablet 10 mg  10 mg Oral QHS Lisbeth RenshawNeelesh Nundkumar, MD   10 mg at 05/20/15 0100  . folic acid (FOLVITE) tablet 1 mg  1 mg Oral  Daily Lisbeth Renshaw, MD      . glimepiride (AMARYL) tablet 1 mg  1 mg Oral QAC breakfast Lisbeth Renshaw, MD   1 mg at 05/20/15 0800  . insulin aspart (novoLOG) injection 0-15 Units  0-15 Units Subcutaneous TID WC Lisbeth Renshaw, MD   2 Units at 05/20/15 1159  . insulin aspart (novoLOG) injection 4 Units  4 Units Subcutaneous TID PC Lisbeth Renshaw, MD   4 Units at 05/20/15 0900  . loperamide (IMODIUM) capsule 2 mg  2 mg Oral PRN Lisbeth Renshaw, MD      . LORazepam (ATIVAN) tablet 0.5 mg  0.5 mg Oral BID PRN Lisbeth Renshaw, MD      . memantine [facility]) tablet 10 mg  10 mg Oral BID Lisbeth Renshaw, MD   10 mg at 05/20/15 0130  . [START ON 05/22/2015] metFORMIN (GLUCOPHAGE) tablet 1,000  mg  1,000 mg Oral BID WC Lisbeth Renshaw, MD      . multivitamin (PROSIGHT) tablet 1 tablet  1 tablet Oral Daily Lisbeth Renshaw, MD      . ondansetron (ZOFRAN) tablet 4 mg  4 mg Oral Q6H PRN Lisbeth Renshaw, MD       Or  . ondansetron (ZOFRAN) injection 4 mg  4 mg Intravenous Q6H PRN Lisbeth Renshaw, MD      . QUEtiapine (SEROQUEL) tablet 25 mg  25 mg Oral q morning - 10a Lisbeth Renshaw, MD      . senna (SENOKOT) tablet 8.6 mg  1 tablet Oral BID Lisbeth Renshaw, MD   8.6 mg at 05/20/15 1000  . tamsulosin (FLOMAX) capsule 0.4 mg  0.4 mg Oral Daily Lisbeth Renshaw, MD      . traZODone (DESYREL) tablet 50 mg  50 mg Oral QHS Lisbeth Renshaw, MD   50 mg at 05/20/15 0100     Discharge Medications: Please see discharge summary for a list of discharge medications.  Relevant Imaging Results:  Relevant Lab Results:   Additional Information SSN [id number]  Derenda Fennel, MSW, LCSWA [phone] 05/20/2015 1:34 PM

## 2015-05-20 NOTE — OR Nursing (Signed)
Gerald [name] from bed control insisted on the patient having an order for ICU post op prior to assigning an ICU bed assignment for an emergent craniotomy for an evacuation of a SDH.  Order placed for transfer to ICU.

## 2015-05-21 LAB — GLUCOSE, CAPILLARY
GLUCOSE-CAPILLARY: 144 mg/dL — AB (ref 65–99)
GLUCOSE-CAPILLARY: 151 mg/dL — AB (ref 65–99)
Glucose-Capillary: 162 mg/dL — ABNORMAL HIGH (ref 65–99)
Glucose-Capillary: 146 mg/dL — ABNORMAL HIGH (ref 65–99)

## 2015-05-21 MED ORDER — RESOURCE THICKENUP CLEAR PO POWD
Freq: Once | ORAL | Status: DC
  Filled 2015-05-21: qty 125

## 2015-05-21 NOTE — Progress Notes (Signed)
No acute events AVSS Awake and alert Improving left hemiparesis Bandage with some dried blood, but in place Doing well Keep in ICU one more day PT/OT/ST Advance diet per speech

## 2015-05-21 NOTE — Evaluation (Signed)
Clinical/Bedside Swallow Evaluation Patient Details  Name: Gerald [name] MRN: [id number] Date of Birth: [birth year]  Today's Date: 05/21/2015 Time: SLP Start Time (ACUTE ONLY): 1400 SLP Stop Time (ACUTE ONLY): 1420 SLP Time Calculation (min) (ACUTE ONLY): 20 min  Past Medical History:  Past Medical History  Diagnosis Date  . Hypertension   . Dysrhythmia   . Diabetes mellitus without complication (HCC)   . Hyperlipidemia   . Near syncope 12/23/2012  . PAF (paroxysmal atrial fibrillation) (HCC)   . Confusion    Past Surgical History:  Past Surgical History  Procedure Laterality Date  . No past surgeries    . Cardioversion N/A 10/31/2012    Procedure: CARDIOVERSION;  Surgeon: Lennette Biharihomas A Kelly, MD;  [facility];  Service: Cardiovascular;  Laterality: N/A;   HPI:  82 y.o. male admitted from SNF with increasing weakness left arm and leg.  Pt was dx with [facility]; decision was made to manage non-surgically, and pt was D[facility]. Due to worsening weakness, pt brought back to hospital; CT showed large right SDH, and pt underwent crani with evacuation 5/12.    Assessment / Plan / Recommendation Clinical Impression  Pt presents with a mild dysphagia, related primarily to impaired mental status, and c/b slowed mastication, likely delayed swallow response, and concerns for aspiration with thin liquids.  Recommend initiating a cautious diet of dysphagia 1, nectar-thick liquids, meds whole in puree, and assist with self-feeding.  Anticipate rapid improvement with PO toleration.  SLP will follow for safety and diet advancement.     Aspiration Risk  Mild aspiration risk    Diet Recommendation     Medication Administration: Whole meds with puree    Other  Recommendations Oral Care Recommendations: Oral care BID Other Recommendations: Order thickener from pharmacy   Follow up Recommendations   (tba)    Frequency and Duration min 2x/week  2  weeks       Prognosis Prognosis for Safe Diet Advancement: Good      Swallow Study   General HPI: 82 y.o. male admitted from SNF with increasing weakness left arm and leg.  Pt was [facility] ago; decision was made to manage non-surgically, and pt [facility]. Due to worsening weakness, pt brought back to hospital; CT showed large right SDH, and pt underwent crani with evacuation 5/12.  Type of Study: Bedside Swallow Evaluation Diet Prior to this Study: NPO Temperature Spikes Noted: No Respiratory Status: Room air History of Recent Intubation: Yes Length of Intubations (days):  (for procedure) Behavior/Cognition: Alert;Confused Oral Cavity Assessment: Dried secretions Oral Cavity - Dentition: Edentulous Vision: Functional for self-feeding Self-Feeding Abilities: Needs assist Patient Positioning: Upright in bed Baseline Vocal Quality: Normal Volitional Cough: Cognitively unable to elicit Volitional Swallow: Unable to elicit    Oral/Motor/Sensory Function Overall Oral Motor/Sensory Function: Generalized oral weakness   Ice Chips Ice chips: Within functional limits   Thin Liquid Thin Liquid: Impaired Presentation: Cup;Self Fed Oral Phase Impairments: Poor awareness of bolus Pharyngeal  Phase Impairments: Multiple swallows;Suspected delayed Swallow    Nectar Thick Nectar Thick Liquid: Impaired Pharyngeal Phase Impairments: Suspected delayed Swallow   Honey Thick Honey Thick Liquid: Not tested   Puree Puree: Impaired Presentation: Spoon Pharyngeal Phase Impairments: Suspected delayed Swallow   Solid   GO   Solid: Not tested        Gerald [name], Gerald [name] 05/21/2015,2:42 PM

## 2015-05-22 LAB — GLUCOSE, CAPILLARY
GLUCOSE-CAPILLARY: 144 mg/dL — AB (ref 65–99)
Glucose-Capillary: 142 mg/dL — ABNORMAL HIGH (ref 65–99)
Glucose-Capillary: 82 mg/dL (ref 65–99)
Glucose-Capillary: 104 mg/dL — ABNORMAL HIGH (ref 65–99)

## 2015-05-22 MED ORDER — PANTOPRAZOLE SODIUM 40 MG PO TBEC
40.0000 mg | DELAYED_RELEASE_TABLET | Freq: Every day | ORAL | Status: DC
  Administered 2015-05-22 – 2015-05-25 (×4): 40 mg via ORAL
  Filled 2015-05-22 (×4): qty 1

## 2015-05-22 NOTE — Progress Notes (Signed)
No acute events Doing well Drain mostly putting out thin, slightly bloody fluid Stable neuro exam Incision looks good D/c drain Transfer to floor

## 2015-05-22 NOTE — Evaluation (Signed)
Physical Therapy Evaluation Patient Details Name: [name] MRN: [id number] DOB: Gerald DineNov 12, 1928 Today's Date: 05/22/2015   History of Present Illness  Gerald [name] is a 82 y.o. male who suffered a fall at his assisted living facility about 4 weeks ago and was found to have a subdural hematoma. He was managed non-operatively and was discharged to [facility]F [facility].  His weakness has actually worsened somewhat and a repeat CT scan showed large R SDH so he underwent craniotomy for evacuation on 05/20/15.  Clinical Impression  Patient presents with significant dependencies for mobility at this time due to deficits listed in PT problem list.  He will benefit from skilled PT in the acute setting to allow decreased burden of care and improved mobility prior to d/c to SNF level rehab.     Follow Up Recommendations SNF    Equipment Recommendations  None recommended by PT    Recommendations for Other Services       Precautions / Restrictions Precautions Precautions: Fall Precaution Comments: very high fall risk      Mobility  Bed Mobility Overal bed mobility: Needs Assistance Bed Mobility: Supine to Sit     Supine to sit: Mod assist;+2 for physical assistance     General bed mobility comments: assist for L leg off bed and +2 to lift trunk upright  Transfers Overall transfer level: Needs assistance Equipment used: 2 person hand held assist Transfers: Squat Pivot Transfers     Squat pivot transfers: Max assist;+2 physical assistance;Total assist     General transfer comment: multiple attempts to stand with multimodal cues for anterior weight shift, keeping feet under him and to release grasp of arm of chair with R UE; able to pivot finally with +2 total A  Ambulation/Gait             General Gait Details: unable  Stairs            Wheelchair Mobility    Modified Rankin (Stroke Patients Only)       Balance Overall balance assessment: Needs  assistance Sitting-balance support: Feet supported Sitting balance-Leahy Scale: Poor Sitting balance - Comments: able to sit with supervision for up to a minute with UE support before losing focus and attempting to lie back down     Standing balance-Leahy Scale: Zero Standing balance comment: unable to stand supported                             Pertinent Vitals/Pain Pain Assessment: No/denies pain    Home Living Family/patient expects to be discharged to:: Skilled nursing facility                      Prior Function Level of Independence: Needs assistance   Gait / Transfers Assistance Needed: reports was ambulatory without devices prior to fall, but pt unreliable.           Hand Dominance        Extremity/Trunk Assessment   Upper Extremity Assessment: LUE deficits/detail       LUE Deficits / Details: keeps L hand fisted, able to open with multimodal cues and assist; note limited awareness of L side needing increaed time to locate and activate; able to activate all muscle groups with significant delay   Lower Extremity Assessment: LLE deficits/detail   LLE Deficits / Details: lifts antigravity with increased time and multimodal cues; decreased awareness of L  Cervical / Trunk Assessment:  Kyphotic  Communication   Communication: No difficulties  Cognition Arousal/Alertness: Awake/alert Behavior During Therapy: WFL for tasks assessed/performed Overall Cognitive Status: Impaired/Different from baseline Area of Impairment: Orientation;Attention;Following commands;Problem solving;Safety/judgement;Awareness Orientation Level: Disoriented to;Place;Time;Situation Current Attention Level: Sustained   Following Commands: Follows one step commands inconsistently;Follows one step commands with increased time Safety/Judgement: Decreased awareness of deficits Awareness: Intellectual Problem Solving: Slow processing;Decreased initiation;Difficulty  sequencing;Requires verbal cues;Requires tactile cues      General Comments General comments (skin integrity, edema, etc.): incision on head with bulb drain    Exercises        Assessment/Plan    PT Assessment Patient needs continued PT services  PT Diagnosis Hemiplegia non-dominant side;Altered mental status;Abnormality of gait   PT Problem List Decreased strength;Decreased cognition;Decreased balance;Decreased mobility;Impaired sensation;Decreased coordination;Decreased safety awareness;Decreased knowledge of precautions;Decreased knowledge of use of DME  PT Treatment Interventions DME instruction;Gait training;Balance training;Functional mobility training;Patient/family education;Cognitive remediation;Therapeutic activities;Therapeutic exercise   PT Goals (Current goals can be found in the Care Plan section) Acute Rehab PT Goals Patient Stated Goal: Patient unable to state, no family available PT Goal Formulation: Patient unable to participate in goal setting Time For Goal Achievement: 06/05/15 Potential to Achieve Goals: Fair    Frequency Min 3X/week   Barriers to discharge        Co-evaluation               End of Session Equipment Utilized During Treatment: Gait belt Activity Tolerance: Patient tolerated treatment well Patient left: in chair;with call bell/phone within reach;with chair alarm set Nurse Communication: Mobility status         Time: 1610-9604 PT Time Calculation (min) (ACUTE ONLY): 26 min   Charges:   PT Evaluation $PT Eval High Complexity: 1 Procedure PT Treatments $Therapeutic Activity: 8-22 mins   PT G CodesElray Mcgregor 06-04-15, 10:13 AM  Sheran Lawless, PT [phone] Jun 04, 2015

## 2015-05-23 ENCOUNTER — Encounter (HOSPITAL_COMMUNITY): Payer: Self-pay | Admitting: Neurosurgery

## 2015-05-23 LAB — GLUCOSE, CAPILLARY
GLUCOSE-CAPILLARY: 124 mg/dL — AB (ref 65–99)
GLUCOSE-CAPILLARY: 135 mg/dL — AB (ref 65–99)
Glucose-Capillary: 149 mg/dL — ABNORMAL HIGH (ref 65–99)
Glucose-Capillary: 179 mg/dL — ABNORMAL HIGH (ref 65–99)

## 2015-05-23 NOTE — Progress Notes (Signed)
Patient transferred from 42M to 5M15 at this time. Patient is alert but confused. Safety precautions and orders reviewed and will reinforce as needed. TELE applied and confirmed. VSS. Pt denied any distress. Will continue to monitor.   Sim BoastHavy, RN

## 2015-05-23 NOTE — Evaluation (Signed)
Occupational Therapy Evaluation Patient Details Name: Gerald [name] MRN: [id number] DOB: 1926-12-24 Today's Date: 05/23/2015    History of Present Illness Gerald [name] is a 82 y.o. male who suffered a fall at his assisted living facility about 4 weeks ago and was found to have a subdural hematoma. He was managed non-operatively and was [facility].  His weakness has actually worsened somewhat and a repeat CT scan showed large R SDH so he underwent craniotomy for evacuation on 05/20/15.   Clinical Impression   Patient is s/p R SDH s/p craniotomy 05/20/15 surgery resulting in functional limitations due to the deficits listed below (see OT problem list). PTA from ALF with unknown baseline function.  Patient will benefit from skilled OT acutely to increase independence and safety with ADLS to allow discharge SNF. Pt with balance, memory and safety deficits that affect all adls. Pt with L side deficits affecting all adls.     Follow Up Recommendations  SNF    Equipment Recommendations  Other (comment) (defer to SNF )    Recommendations for Other Services       Precautions / Restrictions Precautions Precautions: Fall Precaution Comments: very high fall risk      Mobility Bed Mobility Overal bed mobility: Needs Assistance Bed Mobility: Supine to Sit;Sit to Supine     Supine to sit: Max assist Sit to supine: Max assist   General bed mobility comments: required (A) to bring L LE off bed but able to initiate return to supine.   Transfers                 General transfer comment: not attempted will required total +2 (A)    Balance Overall balance assessment: Needs assistance Sitting-balance support: Single extremity supported;Feet supported Sitting balance-Leahy Scale: Poor   Postural control: Left lateral lean                                  ADL Overall ADL's : Needs assistance/impaired Eating/Feeding: Maximal  assistance;Bed level Eating/Feeding Details (indicate cue type and reason): tech in room assisting patient Grooming: Wash/dry hands;Min guard Grooming Details (indicate cue type and reason): cues to avoid wound and lack awareness to surgerical site                               General ADL Comments: bed level only evaluation due to pending transfer to new room and report called. pt sitting EOB with mod (A) and L lateral lean. pt able to lift L LE but needed (A) to place back into bed. pt states "i need your help i cant do it by myself noting some awareness     Vision Vision Assessment?: No apparent visual deficits Additional Comments: during functional task able to verbalize objects seen and function   Perception     Praxis      Pertinent Vitals/Pain Pain Assessment: No/denies pain     Hand Dominance Right   Extremity/Trunk Assessment Upper Extremity Assessment Upper Extremity Assessment: LUE deficits/detail LUE Deficits / Details: minimal movement noted in L hand , pt with tone,. Pt with fist. pt unable to perform shoulder flexion supination pronation or digit extension   Lower Extremity Assessment Lower Extremity Assessment: Defer to PT evaluation   Cervical / Trunk Assessment Cervical / Trunk Assessment: Kyphotic   Communication Communication Communication: No difficulties  Cognition Arousal/Alertness: Awake/alert Behavior During Therapy: Impulsive Overall Cognitive Status: Impaired/Different from baseline Area of Impairment: Orientation;Attention;Memory;Following commands;Safety/judgement;Awareness;Problem solving Orientation Level: Disoriented to;Place;Time;Situation Current Attention Level: Sustained Memory: Decreased recall of precautions;Decreased short-term memory Following Commands: Follows one step commands with increased time Safety/Judgement: Decreased awareness of safety;Decreased awareness of deficits Awareness: Intellectual Problem Solving:  Slow processing;Decreased initiation;Difficulty sequencing General Comments: Pt reports being at his home. pt reports "i am going to need help. will you be my lady to help me" pt shows some awareness to having deficits but unable to verbalize what deficits are. pt recognize 3 objects and proper use ( cup flashlight and pen) pt lack of awareness to L side of body at this time. pt reaching with R UE to complete all L UE commands. Pt following commands but with R UE    General Comments       Exercises       Shoulder Instructions      Home Living Family/patient expects to be discharged to:: Skilled nursing facility                                        Prior Functioning/Environment Level of Independence: Needs assistance  Gait / Transfers Assistance Needed: reports was ambulatory without devices prior to fall, but pt unreliable. ADL's / Homemaking Assistance Needed: unknown at this time        OT Diagnosis: Generalized weakness;Cognitive deficits   OT Problem List: Decreased strength;Decreased range of motion;Decreased activity tolerance;Impaired balance (sitting and/or standing);Decreased coordination;Decreased cognition;Decreased safety awareness;Decreased knowledge of use of DME or AE;Decreased knowledge of precautions;Cardiopulmonary status limiting activity;Impaired UE functional use   OT Treatment/Interventions: Self-care/ADL training;Therapeutic exercise;Neuromuscular education;Energy conservation;DME and/or AE instruction;Therapeutic activities;Cognitive remediation/compensation;Patient/family education;Balance training    OT Goals(Current goals can be found in the care plan section) Acute Rehab OT Goals Patient Stated Goal: "how about me and you take a drive after lunch-- [place] " OT Goal Formulation: Patient unable to participate in goal setting Time For Goal Achievement: 06/06/15 Potential to Achieve Goals: Good  OT Frequency:  Min 2X/week   Barriers to D/C:            Co-evaluation              End of Session Nurse Communication: Mobility status;Precautions  Activity Tolerance: Patient tolerated treatment well Patient left: in bed;with call bell/phone within reach;with bed alarm set;with restraints reapplied (mitten)   Time: 1343-1400 OT Time Calculation (min): 17 min Charges:  OT General Charges $OT Visit: 1 Procedure OT Evaluation $OT Eval Moderate Complexity: 1 Procedure G-Codes:    Harolyn Rutherford 2015/05/30, 2:15 PM    Mateo Flow   OTR/L Pager: [phone] Office: [phone] .

## 2015-05-23 NOTE — Progress Notes (Signed)
No issues overnight. Pt reports minimal HA.  EXAM:  BP 113/71 mmHg  Pulse 54  Temp(Src) 98.4 F (36.9 C) (Axillary)  Resp 18  Ht 5\' 9"  (1.753 m)  Wt 70.308 kg (155 lb)  BMI 22.88 kg/m2  SpO2 96%  Awake, alert, confused Speech fluent CN grossly intact  Minimal movement LUE/LLE Good strength on right  IMPRESSION:  82 y.o. male POD# 3 s/p right crani for SDH, at baseline. Haven't seen significant change in left hemiparesis.  PLAN: - Cont PT/OT - Ready for SNF placement when bed available

## 2015-05-23 NOTE — Care Management Note (Signed)
Case Management Note  Patient Details  Name: Gerald DineRichard L [name] MRN: [id number] Date of Birth: 06/04/1926  Subjective/Objective:   Pt admitted on 05/19/15 with Rt SDH s/p craniotomy on 05/20/15.  PTA, pt resided at home with spouse.                   Action/Plan: PT/OT recommending SNF at dc, and CSW following to facilitate SNF at discharge for rehab.  Case Manager to follow progress.    Expected Discharge Date:                  Expected Discharge Plan:  Skilled Nursing Facility  In-House Referral:  Clinical Social Work  Discharge planning Services  CM Consult  Post Acute Care Choice:    Choice offered to:     DME Arranged:    DME Agency:     HH Arranged:    HH Agency:     Status of Service:  In process, will continue to follow  Medicare Important Message Given:    Date Medicare IM Given:    Medicare IM give by:    Date Additional Medicare IM Given:    Additional Medicare Important Message give by:     If discussed at Long Length of Stay Meetings, dates discussed:    Additional Comments:  Quintella BatonJulie W. Jaice Digioia, RN, BSN  Trauma/Neuro ICU Case Manager [phone]

## 2015-05-23 NOTE — Progress Notes (Signed)
Speech Language Pathology Treatment: Dysphagia  Patient Details Name: Gerald [name] Gerald [name] MRN: [id number] DOB: 16-Feb-1926 Today's Date: 05/23/2015 Time: 4098-11910951-1012 SLP Time Calculation (min) (ACUTE ONLY): 21 min  Assessment / Plan / Recommendation Clinical Impression  No indications of compromised airway during or after cup or straw sips thin liquid with moderate verbal cues for adequate sip size. Endentulous, dentures not in room. Masticated Dys 3 texture, however recommend upgrade to Dys 2 due to ongoing cognitive deficits, decreased awareness and increased risk for pocketing; thin liquid upgrade no straws, pills whole in applesauce and ST continued intervention.    HPI HPI: 82 y.o. male admitted from SNF with increasing weakness left arm and leg.  Pt was [facility] with [facility] ago; decision was made to manage non-surgically, and pt [facility]. Due to worsening weakness, pt brought back to hospital; CT showed large right SDH, and pt underwent crani with evacuation 5/12.       SLP Plan  Continue with current plan of care     Recommendations  Diet recommendations: Dysphagia 2 (fine chop);Thin liquid Liquids provided via: Cup;No straw Medication Administration: Whole meds with puree Supervision: Patient able to self feed;Full supervision/cueing for compensatory strategies Compensations: Slow rate;Small sips/bites;Lingual sweep for clearance of pocketing Postural Changes and/or Swallow Maneuvers: Seated upright 90 degrees             Oral Care Recommendations: Oral care BID Follow up Recommendations:  (TBD) Plan: Continue with current plan of care     GO                Gerald [name], Gerald [name] 05/23/2015, 11:09 AM  Gerald [name] [name]

## 2015-05-24 LAB — TYPE AND SCREEN
ABO/RH(D): B POS
ANTIBODY SCREEN: NEGATIVE
UNIT DIVISION: 0
UNIT DIVISION: 0

## 2015-05-24 LAB — GLUCOSE, CAPILLARY
Glucose-Capillary: 140 mg/dL — ABNORMAL HIGH (ref 65–99)
Glucose-Capillary: 142 mg/dL — ABNORMAL HIGH (ref 65–99)
Glucose-Capillary: 111 mg/dL — ABNORMAL HIGH (ref 65–99)
Glucose-Capillary: 135 mg/dL — ABNORMAL HIGH (ref 65–99)

## 2015-05-24 MED ORDER — HYDROCODONE-ACETAMINOPHEN 5-325 MG PO TABS: 1.0000 | ORAL_TABLET | ORAL | Status: AC | PRN

## 2015-05-24 MED ORDER — LEVETIRACETAM 500 MG PO TABS
500.0000 mg | ORAL_TABLET | Freq: Two times a day (BID) | ORAL | Status: DC
  Administered 2015-05-24 – 2015-05-25 (×2): 500 mg via ORAL
  Filled 2015-05-24 (×2): qty 1

## 2015-05-24 MED ORDER — LEVETIRACETAM 500 MG PO TABS
500.0000 mg | ORAL_TABLET | Freq: Two times a day (BID) | ORAL | Status: AC
Start: 2015-05-24 — End: ?

## 2015-05-24 NOTE — Progress Notes (Signed)
Physical Therapy Treatment Patient Details Name: Gerald [name] MRN: [id number] DOB: 05/24/1926 Today's Date: 05/24/2015    History of Present Illness Gerald [name] is a 82 y.o. male who suffered a fall at his assisted living facility about 4 weeks ago and was found to have a subdural hematoma. He was managed non-operatively and was discharged to SNF at [facility].  His weakness has actually worsened somewhat and a repeat CT scan showed large R SDH so he underwent craniotomy for evacuation on 05/20/15.    PT Comments    Pt con't to have L hemiplegia, have cognitive deficits, and impaired motor planning and sequencing. Pt con't to require maxAx2 for all mobility. Con't to recommend SNF upon d/c.  Follow Up Recommendations  SNF     Equipment Recommendations  None recommended by PT    Recommendations for Other Services       Precautions / Restrictions Precautions Precautions: Fall    Mobility  Bed Mobility Overal bed mobility: Needs Assistance Bed Mobility: Supine to Sit     Supine to sit: Max assist Sit to supine: +2 for physical assistance;HOB elevated   General bed mobility comments: delayed motor planning, max directional v/c's maxA for trunk elevation and management of L LE  Transfers Overall transfer level: Needs assistance Equipment used:  (2 person lift with gait belt and bed pad) Transfers: Stand Pivot Transfers   Stand pivot transfers: Max assist;+2 physical assistance       General transfer comment: maxA to stand, pt unable to sequence stepping and unable to advance L LE  Ambulation/Gait             General Gait Details: unable   Stairs            Wheelchair Mobility    Modified Rankin (Stroke Patients Only)       Balance Overall balance assessment: Needs assistance Sitting-balance support: Single extremity supported Sitting balance-Leahy Scale: Poor Sitting balance - Comments: posterior and L lateral lean Postural control:  Left lateral lean   Standing balance-Leahy Scale: Zero                      Cognition Arousal/Alertness: Awake/alert Behavior During Therapy: Impulsive Overall Cognitive Status: Impaired/Different from baseline Area of Impairment: Orientation;Attention;Memory;Following commands;Safety/judgement;Awareness;Problem solving Orientation Level: Disoriented to;Place;Time;Situation Current Attention Level: Sustained Memory: Decreased recall of precautions;Decreased short-term memory Following Commands: Follows one step commands with increased time;Follows one step commands inconsistently Safety/Judgement: Decreased awareness of safety;Decreased awareness of deficits Awareness: Intellectual Problem Solving: Slow processing;Requires verbal cues;Requires tactile cues      Exercises      General Comments        Pertinent Vitals/Pain Pain Assessment: No/denies pain    Home Living                      Prior Function            PT Goals (current goals can now be found in the care plan section) Progress towards PT goals: Progressing toward goals    Frequency  Min 3X/week    PT Plan Current plan remains appropriate    Co-evaluation             End of Session Equipment Utilized During Treatment: Gait belt Activity Tolerance: Patient tolerated treatment well Patient left: in chair;with call bell/phone within reach;with chair alarm set;with nursing/sitter in room     Time: 4098-1191 PT Time Calculation (min) (ACUTE ONLY): 20 min  Charges:  $Therapeutic Activity: 8-22 mins                    G Codes:      Marcene BrawnChadwell, Aland Chestnutt Marie 05/24/2015, 3:48 PM   Lewis ShockAshly Taiylor Virden, PT, DPT Pager #: [phone] Office #: [phone]

## 2015-05-24 NOTE — Progress Notes (Signed)
No issues overnight. Pt has no complaints.  EXAM:  BP 109/58 mmHg  Pulse 71  Temp(Src) 98.3 F (36.8 C) (Oral)  Resp 18  Ht 5\' 9"  (1.753 m)  Wt 70.308 kg (155 lb)  BMI 22.88 kg/m2  SpO2 99%  Awake, alert, oriented  Speech fluent, appropriate  CN grossly intact  5/5 on right, Minimal voluntary movement LUE/LLE Wound c/d/i  IMPRESSION:  82 y.o. male POD# 4 s/p right crani for SDH, at baseline  PLAN: - will plan on transfer back to SNF tomorrow

## 2015-05-24 NOTE — Discharge Summary (Signed)
Physician Discharge Summary  Patient ID: Gerald [name] MRN: [id number] DOB/AGE: 02/12/1926 82 y.o.  Admit date: 05/19/2015 Discharge date: 05/24/2015  Admission Diagnoses: subdural hematoma  Discharge Diagnoses: Same Active Problems:   Subdural hematoma [facility])   Discharged Condition: Stable  Hospital Course:  Gerald [name] is a 82 y.o. male admitted with progressive worsening at his SNF after suffering a fall 4 weeks prior. CT demonstrated a large subacute right SDH. He was taken to the OR the next day for evacuation. He remained at baseline, unfortunately with no significant change in left-sided weakness. He was monitored in the ICU then transferred to the floor. He remained stable and was discharged back to his SNF.  Treatments: Surgery - right craniotomy for evacuation of SDH  Discharge Exam: Blood pressure 109/58, pulse 71, temperature 98.3 F (36.8 C), temperature source Oral, resp. rate 18, height  (1.753 m), weight 70.308 kg (155 lb), SpO2 99 %. Awake, alert, oriented Speech fluent, appropriate CN grossly intact 5/5 RUE/RLE Minimal movement LUE/LLE Wound c/d/i  Disposition: 01-Home or Self Care     Medication List    STOP taking these medications        apixaban 2.5 MG Tabs tablet  Commonly known as:  ELIQUIS      TAKE these medications        atorvastatin 20 MG tablet  Commonly known as:  LIPITOR  Take 20 mg by mouth daily.     diltiazem 240 MG 24 hr capsule  Commonly known as:  CARDIZEM CD  Take 240 mg by mouth daily.     divalproex 125 MG capsule  Commonly known as:  DEPAKOTE SPRINKLE  Take 250 mg by mouth daily.     donepezil 10 MG tablet  Commonly known as:  ARICEPT  Take 10 mg by mouth at bedtime.     folic acid 1 MG tablet  Commonly known as:  FOLVITE  Take 1 mg by mouth daily.     glimepiride 1 MG tablet  Commonly known as:  AMARYL  Take 1 mg by mouth daily before breakfast.     HYDROcodone-acetaminophen 5-325 MG  tablet  Commonly known as:  NORCO/VICODIN  Take 1 tablet by mouth every 4 (four) hours as needed for moderate pain.     insulin aspart 100 UNIT/ML injection  Commonly known as:  novoLOG  Inject 4 Units into the skin 3 (three) times daily after meals.     levETIRAcetam 500 MG tablet  Commonly known as:  KEPPRA  Take 1 tablet (500 mg total) by mouth 2 (two) times daily.     loperamide 2 MG capsule  Commonly known as:  IMODIUM  Take 2 mg by mouth as needed for diarrhea or loose stools.     LORazepam 0.5 MG tablet  Commonly known as:  ATIVAN  Take 0.5 mg by mouth 2 (two) times daily as needed for anxiety.     memantine 10 MG tablet  Commonly known as:  NAMENDA  Take 1 tablet (10 mg total) by mouth 2 (two) times daily.     metFORMIN 1000 MG tablet  Commonly known as:  GLUCOPHAGE  Take 1,000 mg by mouth 2 (two) times daily with a meal.     permethrin 5 % cream  Commonly known as:  ELIMITE  Apply 1 application topically daily.     PRESERVISION AREDS PO  Take 1 tablet by mouth 2 (two) times daily.     QUEtiapine 25 MG tablet  Commonly known as:  SEROQUEL  2 tabs po qhs     tamsulosin 0.4 MG Caps capsule  Commonly known as:  FLOMAX  Take 0.4 mg by mouth daily.     traZODone 50 MG tablet  Commonly known as:  DESYREL  Take 50 mg by mouth at bedtime.           Follow-up Information    [facility] Lindsi Bayliss, C, MD In 2 weeks.   Specialty:  Neurosurgery   Contact information:   [address] [place] [address] [phone]       Signed: Jackelyn HoehnUNDKUMAR, Shaquinta Peruski, C 05/24/2015, 4:52 PM

## 2015-05-24 NOTE — Clinical Social Work Note (Signed)
[facility] once medically stable for d/c.  CSW remains available as needed.   Derenda FennelBashira Shanessa Hodak, MSW, LCSWA [phone] 05/24/2015 3:24 PM

## 2015-05-25 LAB — GLUCOSE, CAPILLARY
GLUCOSE-CAPILLARY: 134 mg/dL — AB (ref 65–99)
GLUCOSE-CAPILLARY: 97 mg/dL (ref 65–99)
Glucose-Capillary: 122 mg/dL — ABNORMAL HIGH (ref 65–99)

## 2015-05-25 NOTE — Care Management Note (Signed)
Case Management Note  Patient Details  Name: [name] MRN: [id number] Date of Birth: 11-21-1926  Subjective/Objective:                    Action/Plan: Pt discharging to [facility] today. No further needs per CM.   Expected Discharge Date:                  Expected Discharge Plan:  Skilled Nursing Facility  In-House Referral:  Clinical Social Work  Discharge planning Services  CM Consult  Post Acute Care Choice:    Choice offered to:     DME Arranged:    DME Agency:     HH Arranged:    HH Agency:     Status of Service:  Completed, signed off  Medicare Important Message Given:  Yes Date Medicare IM Given:    Medicare IM give by:    Date Additional Medicare IM Given:    Additional Medicare Important Message give by:     If discussed at Long Length of Stay Meetings, dates discussed:    Additional Comments:  Kermit BaloKelli F Kasy Iannacone, RN 05/25/2015, 10:52 AM

## 2015-05-25 NOTE — Progress Notes (Signed)
Pt transported out via PTAR  Payson Evrard M, RN 

## 2015-05-25 NOTE — Care Management Important Message (Signed)
Important Message  Patient Details  Name: Gerald [name] MRN: [id number] Date of Birth: 1926/06/27   Medicare Important Message Given:  Yes    Kermit BaloKelli F Alanea Woolridge, RN 05/25/2015, 9:13 AM

## 2015-05-25 NOTE — Progress Notes (Signed)
Discharge orders received.  Report called to AstronomerTawan RN at [facility].  Awaiting PTAR for transport.  IV removed.  All belongings sent with the patient. Sondra ComeSilva, Fatisha Rabalais M, RN

## 2015-05-25 NOTE — Progress Notes (Signed)
OT Cancellation Note  Patient Details Name: Gerald DineRichard L [name] MRN: [id number] DOB: 02-13-1926   Cancelled Treatment:    Reason Eval/Treat Not Completed: Other (comment) (Plans for patient to discharge to SNF today). Will plan to follow acutely if for some reason patient does not discharge today.   Edwin CapPatricia Hakop Humbarger , MS, OTR/L, CLT Pager: [phone]  05/25/2015, 2:42 PM

## 2015-05-25 NOTE — Progress Notes (Signed)
Speech Language Pathology Treatment: Dysphagia  Patient Details Name: Gerald [name] MRN: [id number] DOB: 1926-07-11 Today's Date: 05/25/2015 Time: 1214-1223 SLP Time Calculation (min) (ACUTE ONLY): 9 min  Assessment / Plan / Recommendation Clinical Impression  Pt doing much better with swallowing and self-feeding; min cues overall for rate/bolus size.  Tolerating dysphagia 2, thin liquids with no overt s/s of aspiration.  Lung sounds are clear.  For D/C to SNF today for rehab.  Recommend SLP f/u for diet advancement/safety - likely short-term.  Recommend cognitive assessment at SNF.     HPI HPI: 83 y.o. male admitted from SNF with increasing weakness left arm and leg.  Pt was dx with SDH at [facility] x 4 weeks ago; decision was made to manage non-surgically, and pt was D/Cd to [facility] SNF. Due to worsening weakness, pt brought back to hospital; CT showed large right SDH, and pt underwent crani with evacuation 5/12.       SLP Plan  All goals met     Recommendations  Diet recommendations: Dysphagia 2 (fine chop);Thin liquid Liquids provided via: Cup;Straw Medication Administration: Whole meds with puree Supervision: Patient able to self feed;Intermittent supervision to cue for compensatory strategies Compensations: Slow rate;Small sips/bites Postural Changes and/or Swallow Maneuvers: Seated upright 90 degrees             Oral Care Recommendations: Oral care BID Follow up Recommendations: Skilled Nursing facility Plan: All goals met     GO                Gerald [name] 05/25/2015, 12:23 PM

## 2015-05-25 NOTE — Clinical Social Work Note (Signed)
Re-admission paperwork to be completed by family.   Clinical Social Worker facilitated patient discharge including contacting patient family and facility to confirm patient discharge plans.  Clinical information faxed to facility and family agreeable with plan.  CSW arranged ambulance transport via [facility].  RN to call report prior to discharge.  Clinical Social Worker will sign off for now as social work intervention is no longer needed. Please consult us again if new need arises.  Gerald [name], MSW, LCSWA [phone] 05/25/2015 8:50 AM

## 2015-06-05 ENCOUNTER — Emergency Department (HOSPITAL_COMMUNITY): Payer: PPO

## 2015-06-05 ENCOUNTER — Encounter (HOSPITAL_COMMUNITY): Payer: Self-pay | Admitting: Emergency Medicine

## 2015-06-05 ENCOUNTER — Emergency Department (HOSPITAL_COMMUNITY)
Admission: EM | Admit: 2015-06-05 | Discharge: 2015-06-05 | Disposition: A | Discharge status: Home / Self Care | Payer: PPO | Attending: Emergency Medicine | Admitting: Emergency Medicine

## 2015-06-05 DIAGNOSIS — Z79899 Other long term (current) drug therapy: Secondary | ICD-10-CM | POA: Insufficient documentation

## 2015-06-05 DIAGNOSIS — Z87891 Personal history of nicotine dependence: Secondary | ICD-10-CM | POA: Diagnosis not present

## 2015-06-05 DIAGNOSIS — R451 Restlessness and agitation: Secondary | ICD-10-CM | POA: Diagnosis present

## 2015-06-05 DIAGNOSIS — I10 Essential (primary) hypertension: Secondary | ICD-10-CM | POA: Diagnosis not present

## 2015-06-05 DIAGNOSIS — E785 Hyperlipidemia, unspecified: Secondary | ICD-10-CM | POA: Insufficient documentation

## 2015-06-05 DIAGNOSIS — X58XXXD Exposure to other specified factors, subsequent encounter: Secondary | ICD-10-CM | POA: Diagnosis not present

## 2015-06-05 DIAGNOSIS — S065X0D Traumatic subdural hemorrhage without loss of consciousness, subsequent encounter: Secondary | ICD-10-CM | POA: Insufficient documentation

## 2015-06-05 DIAGNOSIS — Z794 Long term (current) use of insulin: Secondary | ICD-10-CM | POA: Diagnosis not present

## 2015-06-05 DIAGNOSIS — R410 Disorientation, unspecified: Secondary | ICD-10-CM | POA: Diagnosis not present

## 2015-06-05 DIAGNOSIS — Z7984 Long term (current) use of oral hypoglycemic drugs: Secondary | ICD-10-CM | POA: Diagnosis not present

## 2015-06-05 DIAGNOSIS — E119 Type 2 diabetes mellitus without complications: Secondary | ICD-10-CM | POA: Insufficient documentation

## 2015-06-05 LAB — CBC WITH DIFFERENTIAL/PLATELET
BASOS ABS: 0 10*3/uL (ref 0.0–0.1)
BASOS PCT: 1 %
EOS ABS: 0.2 10*3/uL (ref 0.0–0.7)
Eosinophils Relative: 3 %
HEMATOCRIT: 29.9 % — AB (ref 39.0–52.0)
HEMOGLOBIN: 9 g/dL — AB (ref 13.0–17.0)
Lymphocytes Relative: 23 %
Lymphs Abs: 1.1 10*3/uL (ref 0.7–4.0)
MCH: 25.4 pg — ABNORMAL LOW (ref 26.0–34.0)
MCHC: 30.1 g/dL (ref 30.0–36.0)
MCV: 84.5 fL (ref 78.0–100.0)
Monocytes Absolute: 0.5 10*3/uL (ref 0.1–1.0)
Monocytes Relative: 10 %
NEUTROS ABS: 3 10*3/uL (ref 1.7–7.7)
NEUTROS PCT: 63 %
Platelets: 233 10*3/uL (ref 150–400)
RBC: 3.54 MIL/uL — AB (ref 4.22–5.81)
RDW: 14.6 % (ref 11.5–15.5)
WBC: 4.8 10*3/uL (ref 4.0–10.5)

## 2015-06-05 LAB — BASIC METABOLIC PANEL
Anion gap: 6 (ref 5–15)
BUN: 11 mg/dL (ref 6–20)
CALCIUM: 8.6 mg/dL — AB (ref 8.9–10.3)
CHLORIDE: 104 mmol/L (ref 101–111)
CO2: 28 mmol/L (ref 22–32)
CREATININE: 1.01 mg/dL (ref 0.61–1.24)
GFR calc Af Amer: >60 mL/min (ref >60)
GFR calc non Af Amer: >60 mL/min (ref >60)
GLUCOSE: 199 mg/dL — AB (ref 65–99)
Potassium: 4 mmol/L (ref 3.5–5.1)
Sodium: 138 mmol/L (ref 135–145)

## 2015-06-05 LAB — URINALYSIS, ROUTINE W REFLEX MICROSCOPIC
BILIRUBIN URINE: NEGATIVE
GLUCOSE, UA: NEGATIVE mg/dL
Hgb urine dipstick: NEGATIVE
KETONES UR: 15 mg/dL — AB
LEUKOCYTES UA: NEGATIVE
Nitrite: NEGATIVE
PH: 7.5 (ref 5.0–8.0)
Protein, ur: NEGATIVE mg/dL
SPECIFIC GRAVITY, URINE: 1.019 (ref 1.005–1.030)

## 2015-06-05 MED ORDER — LORAZEPAM 1 MG PO TABS
1.0000 mg | ORAL_TABLET | Freq: Once | ORAL | Status: AC
  Administered 2015-06-05: 1 mg via ORAL
  Filled 2015-06-05: qty 1

## 2015-06-05 NOTE — ED Notes (Addendum)
From [facility] via EMS: Report from EMS is staff at nursing home said increased agitation x 4 days; given Haldol yesterday IM, given PO Ativan this morning. EMS stated no agitation in route. Arrives not agitated, calm, cooperative and following commands. Oriented to self, partial time (knew month, not year), and place. Had surgery for subdural hematoma on May 12. 36 sutures still in head, incision lines well approximated.

## 2015-06-05 NOTE — ED Notes (Signed)
Patient transported to CT 

## 2015-06-05 NOTE — ED Notes (Signed)
Patient alert and undressing; states it is time to go. More fidgety than has been all ED course. Orders from [facility] state BID Ativan prn and given last this morning. O'Rourke notified, he stated to given dose of ativan now.

## 2015-06-05 NOTE — ED Provider Notes (Signed)
CSN: [id number]     Arrival date & time 06/05/15  1306 History   First MD Initiated Contact with Patient 06/05/15 1310     Chief Complaint  Patient presents with  . Agitation     (Consider location/radiation/quality/duration/timing/severity/associated sxs/prior Treatment) Patient is a 82 y.o. male presenting with general illness. The history is provided by the patient, medical records, a relative and the nursing home.  Illness Location:  Change in mental status Severity:  Mild Onset quality:  Unable to specify Duration:  4 days Timing:  Intermittent Progression:  Waxing and waning Chronicity:  Chronic Context:  Lives at a SNF. History of dementia. Recent craniotomy for SDH. No fevers or chills. No infectious symptoms. Has been acting intermittently confused at nighttime at this nursing facility. Sent here for evaluation. Associated symptoms: no abdominal pain, no chest pain, no cough, no diarrhea, no fever, no headaches, no nausea, no shortness of breath and no vomiting     Past Medical History  Diagnosis Date  . Hypertension   . Dysrhythmia   . Diabetes mellitus without complication (HCC)   . Hyperlipidemia   . Near syncope 12/23/2012  . PAF (paroxysmal atrial fibrillation) (HCC)   . Confusion    Past Surgical History  Procedure Laterality Date  . No past surgeries    . Cardioversion N/A 10/31/2012    Procedure: CARDIOVERSION;  Surgeon: Lennette Biharihomas A Kelly, MD;  Location: [facility] ENDOSCOPY;  Service: Cardiovascular;  Laterality: N/A;  . Craniotomy Right 05/20/2015    Procedure: Right Caniotomy for Evacuation of Subdural Hematoma;  Surgeon: Lisbeth RenshawNeelesh Nundkumar, MD;  Location: [facility] NEURO ORS;  Service: Neurosurgery;  Laterality: Right;   Family History  Problem Relation Age of Onset  . Heart disease Mother   . Heart failure Mother    Social History  Substance Use Topics  . Smoking status: Former Smoker    Quit date: 03/06/1978  . Smokeless tobacco: Never Used     Comment: quit  smoking about 35 years ago.  . Alcohol Use: No    Review of Systems  Constitutional: Negative for fever and chills.  Respiratory: Negative for cough and shortness of breath.   Cardiovascular: Negative for chest pain.  Gastrointestinal: Negative for nausea, vomiting, abdominal pain and diarrhea.  Genitourinary: Negative for dysuria.  Musculoskeletal: Negative for neck pain and neck stiffness.  Neurological: Positive for weakness (Left upper extremity; consistent with baseline). Negative for light-headedness and headaches.  Psychiatric/Behavioral: Positive for confusion.  All other systems reviewed and are negative.     Allergies  Doxazosin and Zocor  Home Medications   Prior to Admission medications   Medication Sig Start Date End Date Taking? Authorizing Provider  atorvastatin (LIPITOR) 20 MG tablet Take 20 mg by mouth daily.   Yes Historical Provider, MD  diltiazem (CARDIZEM CD) 240 MG 24 hr capsule Take 240 mg by mouth daily.   Yes Historical Provider, MD  divalproex (DEPAKOTE SPRINKLE) 125 MG capsule Take 250 mg by mouth daily.   Yes Historical Provider, MD  donepezil (ARICEPT) 10 MG tablet Take 10 mg by mouth at bedtime.   Yes Historical Provider, MD  folic acid (FOLVITE) 1 MG tablet Take 1 mg by mouth daily.   Yes Historical Provider, MD  glimepiride (AMARYL) 1 MG tablet Take 1 mg by mouth daily before breakfast.   Yes Historical Provider, MD  HYDROcodone-acetaminophen (NORCO/VICODIN) 5-325 MG tablet Take 1 tablet by mouth every 4 (four) hours as needed for moderate pain. 05/24/15  Yes Lisbeth RenshawNeelesh Nundkumar,  MD  insulin aspart (NOVOLOG) 100 UNIT/ML injection Inject 4 Units into the skin 3 (three) times daily after meals.   Yes Historical Provider, MD  levETIRAcetam (KEPPRA) 500 MG tablet Take 1 tablet (500 mg total) by mouth 2 (two) times daily. 05/24/15  Yes Lisbeth Renshaw, MD  loperamide (IMODIUM) 2 MG capsule Take 2 mg by mouth as needed for diarrhea or loose stools.   Yes  Historical Provider, MD  LORazepam (ATIVAN) 0.5 MG tablet Take 0.5 mg by mouth 2 (two) times daily as needed for anxiety.   Yes Historical Provider, MD  memantine (NAMENDA) 10 MG tablet Take 1 tablet (10 mg total) by mouth 2 (two) times daily. 01/21/14  Yes Levert Feinstein, MD  metFORMIN (GLUCOPHAGE) 1000 MG tablet Take 1,000 mg by mouth 2 (two) times daily with a meal.   Yes Historical Provider, MD  Multiple Vitamins-Minerals (PRESERVISION AREDS PO) Take 1 tablet by mouth 2 (two) times daily.   Yes Historical Provider, MD  QUEtiapine (SEROQUEL) 25 MG tablet 2 tabs po qhs Patient taking differently: Take 50 mg by mouth at bedtime.  03/25/14  Yes Levert Feinstein, MD  tamsulosin (FLOMAX) 0.4 MG CAPS capsule Take 0.4 mg by mouth daily.    Yes Historical Provider, MD  traZODone (DESYREL) 50 MG tablet Take 50 mg by mouth at bedtime.   Yes Historical Provider, MD  permethrin (ELIMITE) 5 % cream Apply 1 application topically daily.    Historical Provider, MD   BP 136/93 mmHg  Pulse 99  Temp(Src) 97.7 F (36.5 C) (Oral)  Resp 18  SpO2 99% Physical Exam  Constitutional: He appears well-developed and well-nourished. No distress.  HENT:  Head:    Eyes: EOM are normal. Pupils are equal, round, and reactive to light.  Cardiovascular: Normal rate, regular rhythm and intact distal pulses.   Pulmonary/Chest: Effort normal. No respiratory distress.  Abdominal: Soft. There is no tenderness. There is no rebound and no guarding.  Musculoskeletal: He exhibits no edema.  Neurological: He is alert. No cranial nerve deficit or sensory deficit. GCS eye subscore is 3. GCS verbal subscore is 5. GCS motor subscore is 6.  Patient has weakness in his left upper extremity and left lower extremity, consistent with his baseline since his traumatic subdural hematoma requiring evacuation. No other focal neurological deficits identified.  Skin: Skin is warm and dry.    ED Course  Procedures (including critical care time) Labs  Review Labs Reviewed  CBC WITH DIFFERENTIAL/PLATELET - Abnormal; Notable for the following:    RBC 3.54 (*)    Hemoglobin 9.0 (*)    HCT 29.9 (*)    MCH 25.4 (*)    All other components within normal limits  BASIC METABOLIC PANEL - Abnormal; Notable for the following:    Glucose, Bld 199 (*)    Calcium 8.6 (*)    All other components within normal limits  URINALYSIS, ROUTINE W REFLEX MICROSCOPIC (NOT AT [facility]) - Abnormal; Notable for the following:    Ketones, ur 15 (*)    All other components within normal limits    Imaging Review Dg Chest 2 View  06/05/2015  CLINICAL DATA:  Altered mental status; pt unable to provide any history; per ED notes, pt here from nursing home due to increased agitation x 4 days; h/o HTN and DM; former smoker EXAM: CHEST  2 VIEW COMPARISON:  01/08/2014 FINDINGS: Lateral view degraded by patient arm position. Mild hyperinflation. Midline trachea. Mild cardiomegaly with a tortuous atherosclerotic thoracic aorta.  No pleural effusion or pneumothorax. Bilateral calcified pleural plaques are identified. No superimposed consolidation. IMPRESSION: No acute cardiopulmonary disease. Cardiomegaly without congestive failure. Bilateral calcified pleural plaques, consistent with asbestos related pleural disease. Electronically Signed   By: Jeronimo Greaves M.D.   On: 06/05/2015 15:39   Ct Head Wo Contrast  06/05/2015  CLINICAL DATA:  Craniotomy 05/20/2015 for subdural. Increased agitation EXAM: CT HEAD WITHOUT CONTRAST TECHNIQUE: Contiguous axial images were obtained from the base of the skull through the vertex without intravenous contrast. COMPARISON:  CT head 05/19/2015 FINDINGS: Postop right frontal craniotomy for subdural drainage. The large right subdural hematoma on the prior study has improved. There is now mixed density subdural hematoma measuring 19 mm in thickness. Mild frontal pneumocephalus. Left frontal subdural fluid collection is predominantly low density with some  higher density blood products layering posteriorly. This measures 17 mm in thickness, unchanged from the prior study. No significant shift of the midline structures. Moderate atrophy. Compression of the lateral ventricles due to bilateral subdural collections. No acute infarct or mass. IMPRESSION: Recent right frontal craniotomy for subdural hematoma drainage. Decreased size of mixed density right-sided subdural hematoma now measuring 19 mm in thickness. Mild pneumocephalus on the right Left-sided mixed density subdural hematoma measuring 17 mm unchanged from the prior study. No midline shift. Electronically Signed   By: Marlan Palau M.D.   On: 06/05/2015 15:59   I have personally reviewed and evaluated these images and lab results as part of my medical decision-making.   EKG Interpretation None      MDM   Final diagnoses:  Confusion    82 year old male with a history of dementia, lives at a skilled nursing facility, history of traumatic subdural hematoma requiring evacuation in the last couple weeks, presenting with intermittent confusion his nursing facility. Feel the most likely diagnosis for the patient is progression of his dementia and likely sundowning at the facility. Patient has had no fevers and chills. He is afebrile here. He has had no localizing infectious symptoms. No evidence of UTI. No leukocytosis. Chest x-ray without evidence of pneumonia. Doubt infection. CT of his head shows decreased size of his subdermal hematoma on the right and stable subdural hematoma on the left. No evidence of any other acute pathology. No masses. No new bleeds. No other new focal neurological deficits appreciated on exam different from his baseline. Otherwise, his electrolyte are within normal limits. He does have anemia; this is consistent with his last blood draw a couple weeks ago.  Strict return precautions provided. Spoke with the son regarding this. The son is agreeable to having his father to  return to the nursing facility at this time. Encouraged him to have a follow-up visit with the nursing facility physician as soon as possible.  Patient discharged in stable condition.  Lindalou Hose, MD 06/05/15 1641  Gwyneth Sprout, MD 06/07/15 [phone]

## 2015-08-09 DEATH — deceased

## 2017-10-12 IMAGING — CT CT HEAD W/O CM
1 series · 15 of 30 positions shown, 19 images · non-contrast
Comparison: 05/08/2014.  Report from Arissa Billiot 04/23/2015

CLINICAL DATA: Subarachnoid hemorrhage follow-up.

EXAM:
CT HEAD WITHOUT CONTRAST
TECHNIQUE: Contiguous axial images were obtained from the base of the skull
through the vertex without intravenous contrast.

[Series 2: headseq 4.8 h45s · axial · 0.43mm/px · z∈[+1167,+1322]mm · 15 of 36 slices shown, 19 images]
[im 2/36  brain]
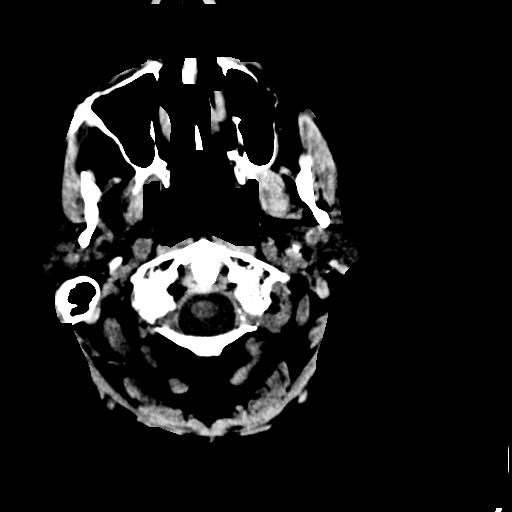
[im 2/36  bone]
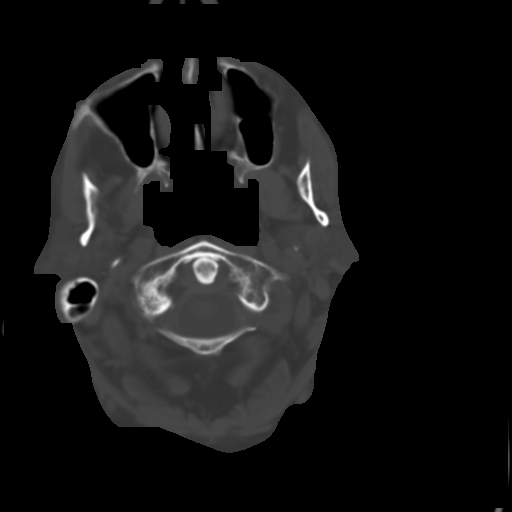
[im 4/36  brain]
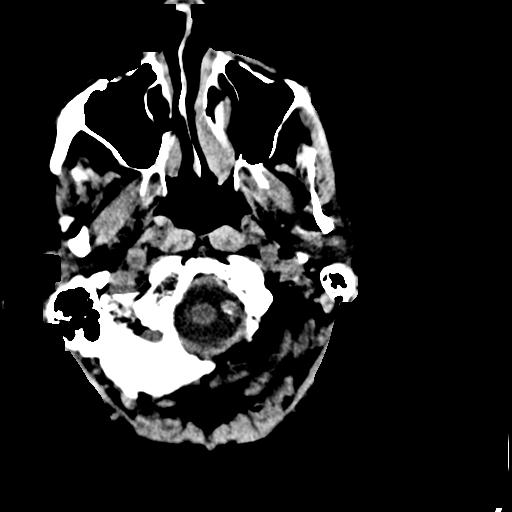
[im 7/36  brain]
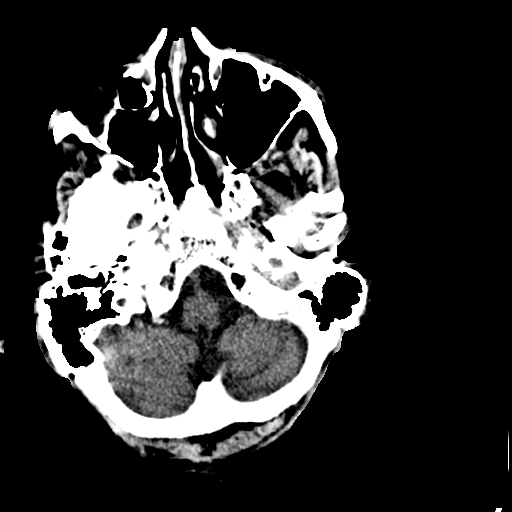
[im 9/36  brain]
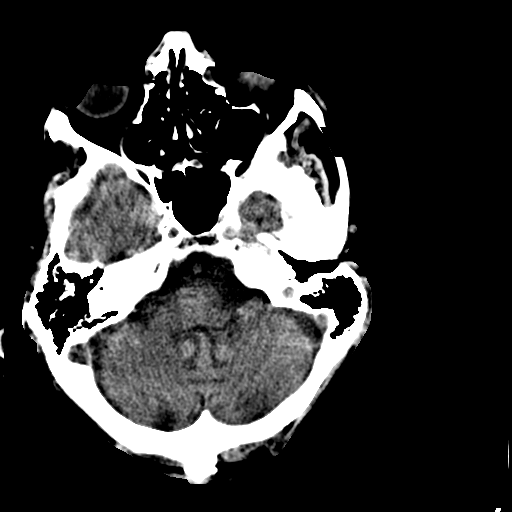
[im 11/36  brain]
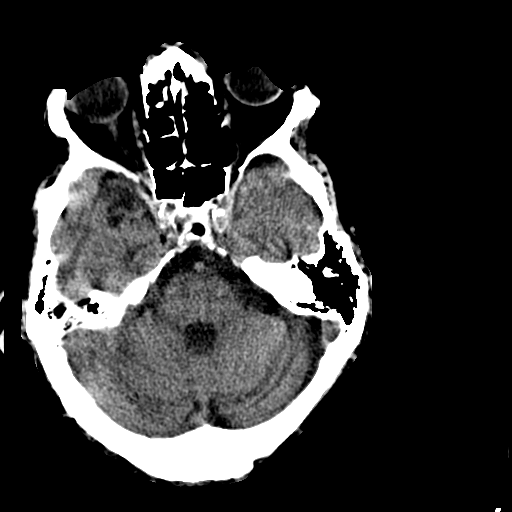
[im 11/36  bone]
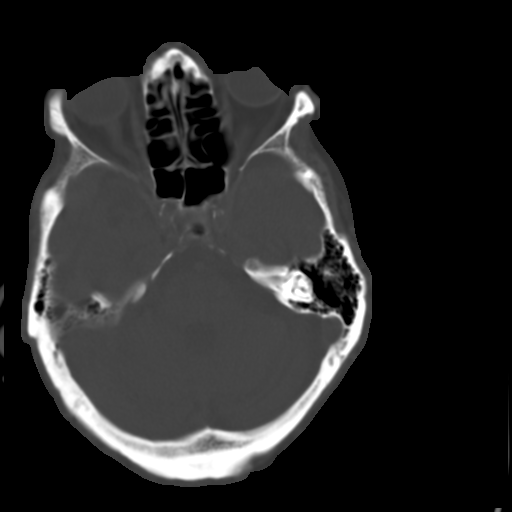
[im 14/36  brain]
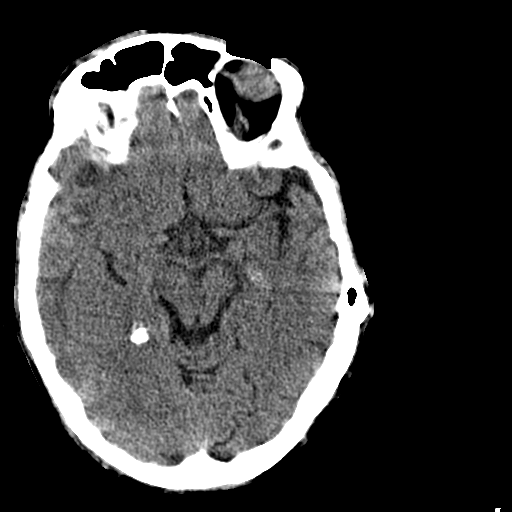
[im 16/36  brain]
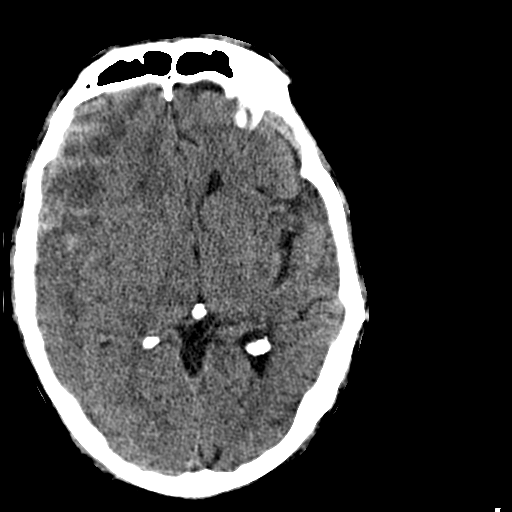
[im 19/36  brain]
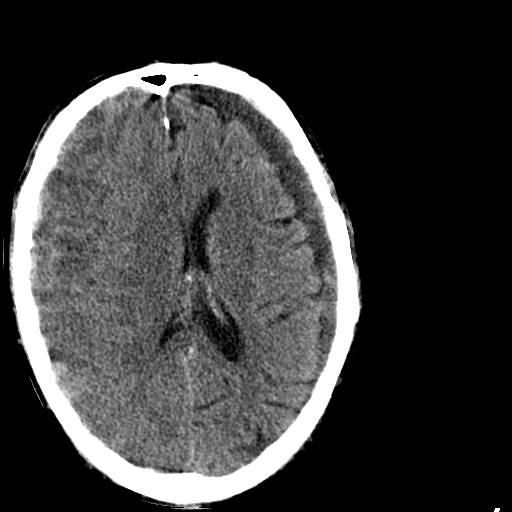
[im 20/36  brain]
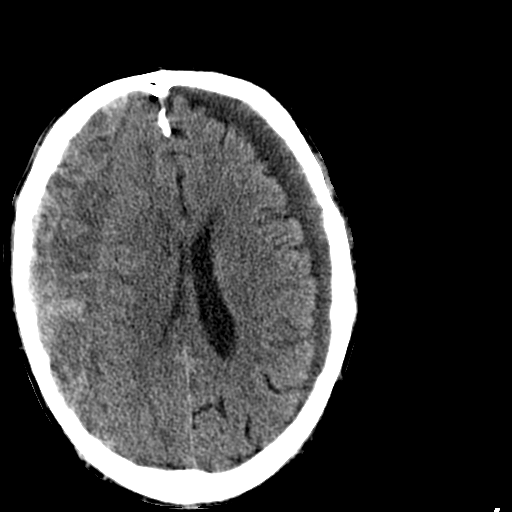
[im 20/36  bone]
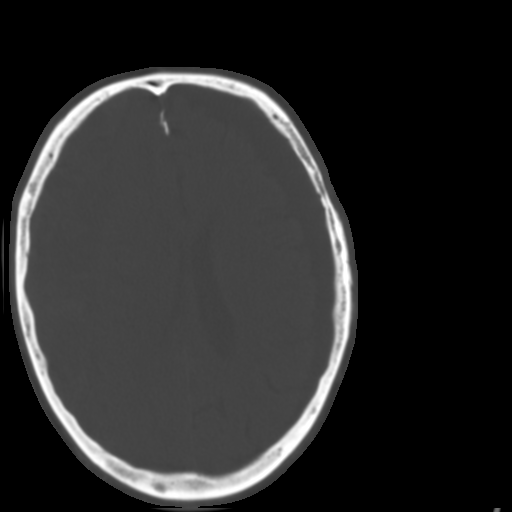
[im 22/36  brain]
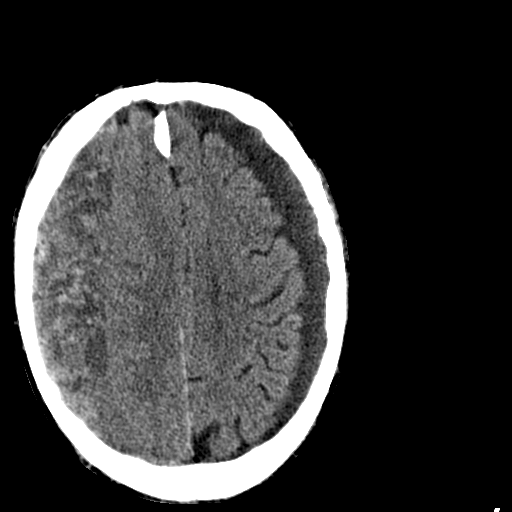
[im 25/36  brain]
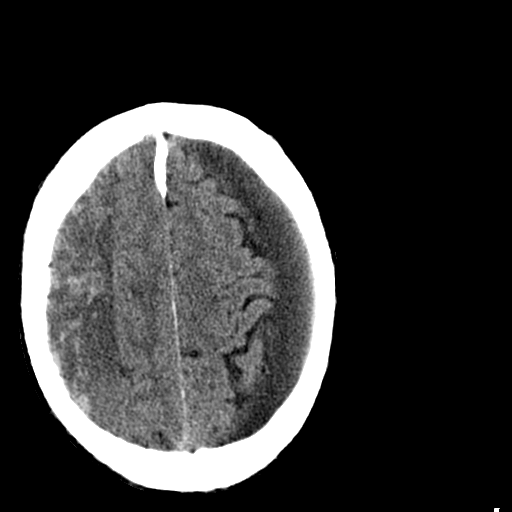
[im 27/36  brain]
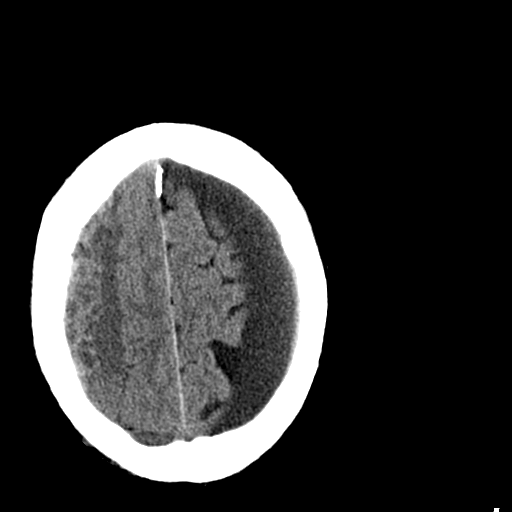
[im 29/36  brain]
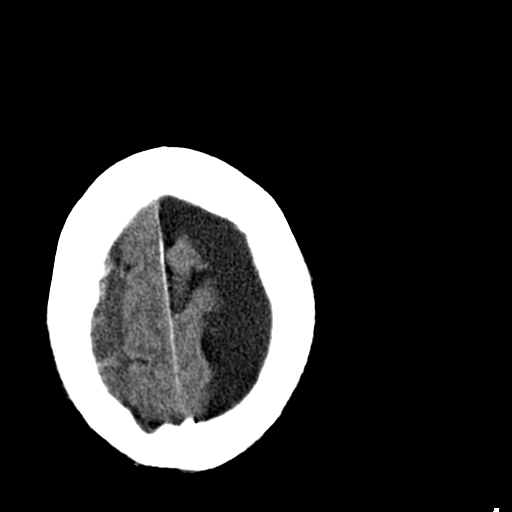
[im 29/36  bone]
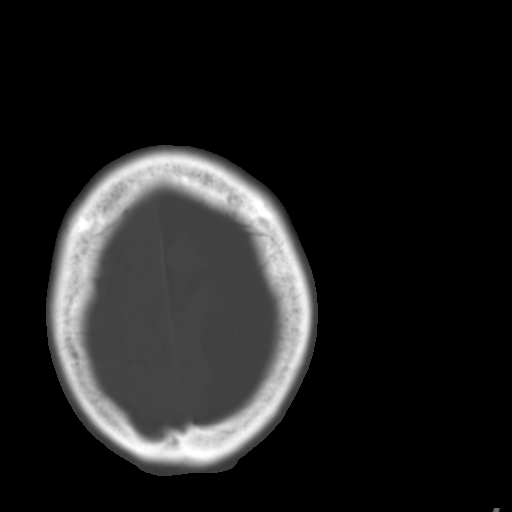
[im 32/36  brain]
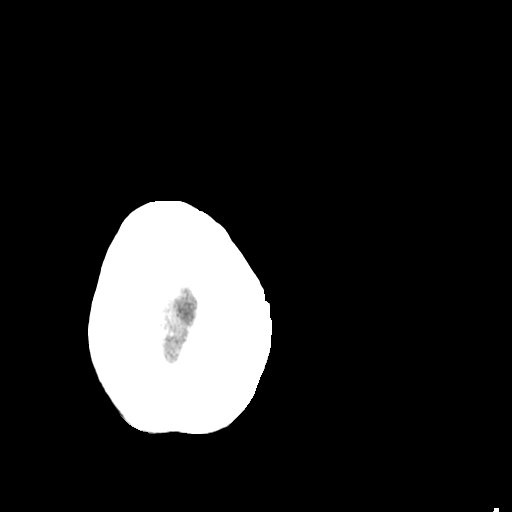
[im 34/36  brain]
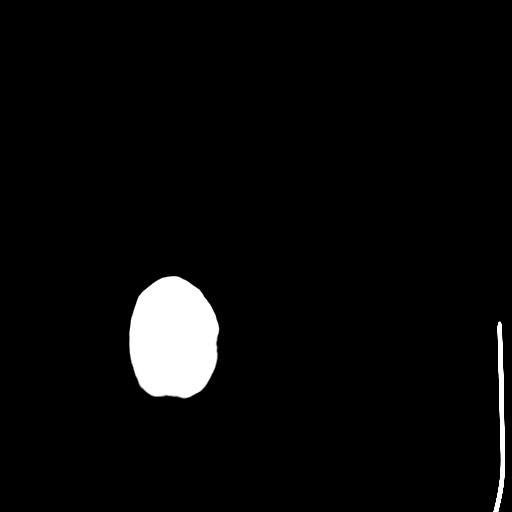

[15 of 30 positions shown; findings below may reference images not displayed]

FINDINGS: Skull and Sinuses:Negative for fracture or hemo sinus. Left
supraorbital scalp hematoma.

Visualized orbits: Negative.

Brain: There is a 3 cm thick holo hemispheric right subdural
collection with mixed density. High-density areas consistent with
membranes or recent blood clot. There is a subdural collection
around the left cerebral hemisphere which is high density anteriorly
but predominantly low-density, 18 mm in maximal thickness. Both
collections have increased in size compared to report from most
recent scan in [REDACTED]. There is mass effect in the
bilateral cerebral hemispheres with prominent flattening on the
right at the vertex. Complete effacement of the right lateral
ventricle with 8 mm midline shift. No acute infarct. No subarachnoid
or intraventricular hemorrhage.

Critical Value/emergent results were called by telephone at the time
of interpretation on 05/19/2015 at [DATE] to Dr. Chunhwan, who
verbally acknowledged these results.
IMPRESSION: 1. Mixed density subdural hematoma on the right measuring 31 mm
thickness with mass effect on the cerebral hemisphere and midline
shift of 8 mm. This has enlarged from 24mm when correlated with
report from [REDACTED] 04/23/2015.
2. Predominately low-density left subdural hematoma measuring up to
18 mm in thickness.
